# Patient Record
Sex: Female | Born: 1960
Health system: Southern US, Community
[De-identification: ages and names within clinical notes are randomized; demographics above are authoritative.]

## PROBLEM LIST (undated history)

## (undated) DIAGNOSIS — E079 Disorder of thyroid, unspecified: Secondary | ICD-10-CM

## (undated) DIAGNOSIS — E785 Hyperlipidemia, unspecified: Secondary | ICD-10-CM

## (undated) DIAGNOSIS — G4733 Obstructive sleep apnea (adult) (pediatric): Secondary | ICD-10-CM

## (undated) DIAGNOSIS — R569 Unspecified convulsions: Secondary | ICD-10-CM

## (undated) DIAGNOSIS — G4710 Hypersomnia, unspecified: Secondary | ICD-10-CM

## (undated) HISTORY — DX: Obstructive sleep apnea (adult) (pediatric): G47.33

## (undated) HISTORY — DX: Hypersomnia, unspecified: G47.10

## (undated) HISTORY — DX: Disorder of thyroid, unspecified: E07.9

## (undated) HISTORY — DX: Hyperlipidemia, unspecified: E78.5

## (undated) HISTORY — DX: Unspecified convulsions: R56.9

---

## 1997-09-01 ENCOUNTER — Ambulatory Visit (HOSPITAL_COMMUNITY): Admission: RE | Admit: 1997-09-01 | Discharge: 1997-09-01 | Payer: Self-pay | Admitting: Obstetrics and Gynecology

## 1998-10-12 ENCOUNTER — Other Ambulatory Visit: Admission: RE | Admit: 1998-10-12 | Discharge: 1998-10-12 | Payer: Self-pay | Admitting: Obstetrics and Gynecology

## 2000-02-13 ENCOUNTER — Other Ambulatory Visit: Admission: RE | Admit: 2000-02-13 | Discharge: 2000-02-13 | Payer: Self-pay | Admitting: Obstetrics and Gynecology

## 2001-03-19 ENCOUNTER — Other Ambulatory Visit: Admission: RE | Admit: 2001-03-19 | Discharge: 2001-03-19 | Payer: Self-pay | Admitting: Obstetrics and Gynecology

## 2002-05-05 ENCOUNTER — Other Ambulatory Visit: Admission: RE | Admit: 2002-05-05 | Discharge: 2002-05-05 | Payer: Self-pay | Admitting: Obstetrics and Gynecology

## 2003-06-02 ENCOUNTER — Other Ambulatory Visit: Admission: RE | Admit: 2003-06-02 | Discharge: 2003-06-02 | Payer: Self-pay | Admitting: Obstetrics and Gynecology

## 2004-08-15 ENCOUNTER — Other Ambulatory Visit: Admission: RE | Admit: 2004-08-15 | Discharge: 2004-08-15 | Payer: Self-pay | Admitting: Obstetrics and Gynecology

## 2012-10-08 ENCOUNTER — Telehealth: Payer: Self-pay | Admitting: Neurology

## 2012-10-09 ENCOUNTER — Encounter: Payer: Self-pay | Admitting: Neurology

## 2012-10-09 ENCOUNTER — Telehealth: Payer: Self-pay | Admitting: Neurology

## 2012-10-09 DIAGNOSIS — G40309 Generalized idiopathic epilepsy and epileptic syndromes, not intractable, without status epilepticus: Secondary | ICD-10-CM | POA: Insufficient documentation

## 2012-10-09 DIAGNOSIS — G40219 Localization-related (focal) (partial) symptomatic epilepsy and epileptic syndromes with complex partial seizures, intractable, without status epilepticus: Secondary | ICD-10-CM | POA: Insufficient documentation

## 2012-10-09 NOTE — Telephone Encounter (Signed)
Pt calling for refill of medication for seizures.   Needs appt .then can refill.  Sent to [facility] in scheduling.

## 2012-10-12 ENCOUNTER — Other Ambulatory Visit: Payer: Self-pay

## 2012-10-12 MED ORDER — CARBAMAZEPINE 200 MG PO TABS: 400.0000 mg | ORAL_TABLET | Freq: Two times a day (BID) | ORAL | Status: DC

## 2012-10-12 NOTE — Telephone Encounter (Signed)
System will not allow me to refill med on this encounter.  It says the order is being placed on a future encounter an cannot be edited.  I will open new encounter and send one refill for now, since we are waiting for an appt to be scheduled.

## 2012-10-12 NOTE — Telephone Encounter (Signed)
Former [facility] patient assigned to Dr Dohmeier  

## 2012-10-14 ENCOUNTER — Other Ambulatory Visit: Payer: Self-pay | Admitting: Neurology

## 2012-10-14 MED ORDER — CARBAMAZEPINE 200 MG PO TABS: 400.0000 mg | ORAL_TABLET | Freq: Two times a day (BID) | ORAL | Status: DC

## 2012-10-14 MED ORDER — CLORAZEPATE DIPOTASSIUM 3.75 MG PO TABS: 3.7500 mg | ORAL_TABLET | Freq: Two times a day (BID) | ORAL | Status: DC

## 2012-10-14 NOTE — Telephone Encounter (Signed)
Patient has been assigned to Dr Vickey Huger.

## 2012-10-16 NOTE — Telephone Encounter (Signed)
done

## 2012-11-10 ENCOUNTER — Ambulatory Visit: Payer: Self-pay | Admitting: Neurology

## 2012-11-12 ENCOUNTER — Other Ambulatory Visit (HOSPITAL_COMMUNITY)
Admission: RE | Admit: 2012-11-12 | Discharge: 2012-11-12 | Disposition: A | Discharge status: Home / Self Care | Payer: BC Managed Care – PPO | Source: Ambulatory Visit | Attending: Family Medicine | Admitting: Family Medicine

## 2012-11-12 ENCOUNTER — Other Ambulatory Visit: Payer: Self-pay | Admitting: Physician Assistant

## 2012-11-12 DIAGNOSIS — Z124 Encounter for screening for malignant neoplasm of cervix: Secondary | ICD-10-CM | POA: Insufficient documentation

## 2012-12-17 ENCOUNTER — Other Ambulatory Visit: Payer: Self-pay | Admitting: Neurology

## 2012-12-17 ENCOUNTER — Encounter: Payer: Self-pay | Admitting: Neurology

## 2012-12-17 ENCOUNTER — Ambulatory Visit (INDEPENDENT_AMBULATORY_CARE_PROVIDER_SITE_OTHER): Payer: BC Managed Care – PPO | Admitting: Neurology

## 2012-12-17 VITALS — BP 118/76 | HR 77 | Resp 16 | Ht 61.0 in | Wt 193.0 lb

## 2012-12-17 DIAGNOSIS — G4710 Hypersomnia, unspecified: Secondary | ICD-10-CM

## 2012-12-17 DIAGNOSIS — R0683 Snoring: Secondary | ICD-10-CM

## 2012-12-17 DIAGNOSIS — G40209 Localization-related (focal) (partial) symptomatic epilepsy and epileptic syndromes with complex partial seizures, not intractable, without status epilepticus: Secondary | ICD-10-CM

## 2012-12-17 DIAGNOSIS — G4730 Sleep apnea, unspecified: Secondary | ICD-10-CM

## 2012-12-17 DIAGNOSIS — R0989 Other specified symptoms and signs involving the circulatory and respiratory systems: Secondary | ICD-10-CM

## 2012-12-17 DIAGNOSIS — G40219 Localization-related (focal) (partial) symptomatic epilepsy and epileptic syndromes with complex partial seizures, intractable, without status epilepticus: Secondary | ICD-10-CM

## 2012-12-17 DIAGNOSIS — R0609 Other forms of dyspnea: Secondary | ICD-10-CM

## 2012-12-17 DIAGNOSIS — G40309 Generalized idiopathic epilepsy and epileptic syndromes, not intractable, without status epilepticus: Secondary | ICD-10-CM

## 2012-12-17 HISTORY — DX: Hypersomnia, unspecified: G47.10

## 2012-12-17 MED ORDER — CLORAZEPATE DIPOTASSIUM 3.75 MG PO TABS: 3.7500 mg | ORAL_TABLET | Freq: Two times a day (BID) | ORAL | Status: DC

## 2012-12-17 MED ORDER — CARBAMAZEPINE 200 MG PO TABS: 200.0000 mg | ORAL_TABLET | Freq: Two times a day (BID) | ORAL | Status: DC

## 2012-12-17 NOTE — Progress Notes (Signed)
[facility]  Provider:  Dr Vickey Huger Referring Provider: No ref. provider found Primary Care Physician:  REDMON,NOELLE, PA-C  Chief Complaint  Patient presents with  . Follow-up    epilepsy,rm 10,formerly Dr. Imagene Gurney patient    HPI:  Lori Arellano is a 52 y.o. female here as a referral from [facility] .    Lori Arellano's history as a patient of Dr. Sandria Manly and Dr. Ninetta Lights reaches back over 30 years. The patient began having seizures during her kindergarten year is. The diagnosis was of a was not made until the second grade of elementary school at that time she reports hearing voices and she would exited involuntary behavior followed by a complex partial seizure she also developed secondary generalized seizures after she married and moved to [place] she returned to [place] in 1995 and again was followed by Dr. love. 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 A CT scan and 80 showed a small cystic structure as well same location. EEG studies in 1991 are normal in the awake state.  On average this patient has suffered approximately one seizure per year but was seizure-free on going from 2006 her or her or portal meter include tingling of her face or ringing in the ear difficulties focusing feeling foggy than followed by a complex partial seizure these are as lasted for about 30 seconds some of them were never followed by seizures. The patient remains on carbamazepine 200 mg twice a day, and see in 3.75 mg 1 tablet twice a day, she also is treated for hypothyroidism 100 mcg of levothyroxine once daily she takes Vytorin once daily multivitamins once daily and 1200 milligrams of calcium twice a day. She saw her primary neurologist Dr. Sandria Manly in March 2011 and  Last in April 2013 each visit was routine- there was no  longer any aura experienced or reported.   In her last visit Dr. Sandria Manly  of addressed the possibility that she may suffer from obstructive sleep apnea but she did not want to pursue an evaluation at that time. The patient has continued to gain weight. She occasionally has nights was very poor sleep quality and feels not restored or refreshed in the morning. There is no report of nocturnal seizures. The patient average bedtime is around 11 PM at times it may take her 30 minutes to fall asleep and at others 2 hours.  She rises at 5.45 AM , unrefreshed. Dry mouth, no headaches . She has occasional hot flashes and is in menopause. She may have an average sleep time total of 6 hours . Nocturia is once at night, husband reports loud snoring since 2000, the year she reached a 30 pound weight gain up  from 165 pounds.      She discontinued birth control pills in October 2008,  normal liver function tests and thyroid studies in 2011 and 2012, last carbamazepine level in 2013 was  8.4 , in 2012 it was 9.9.     Review of Systems: Out of a complete 14 system review, the patient complains of only the following symptoms, and all other reviewed systems are negative.  seizure free since 2006 - no change in meds.    Epworth 13 - falling asleep  Watching TV, while reading. Waiting room,  Drowsy driving. Snoring, weight  gain.  History   Social History  . Marital Status: Married    Spouse Name: N/A    Number of Children: 1  . Years of Education: N/A   Occupational History  .      preschool teacher   Social History Main Topics  . Smoking status: Never Smoker   . Smokeless tobacco: Not on file  . Alcohol Use: Not on file  . Drug Use: Not on file  . Sexually Active: Not on file   Other Topics Concern  . Not on file   Social History Narrative  . No narrative on file    Family History  Problem Relation Age of Onset  . Diabetes Father     Past Medical History  Diagnosis Date  . Thyroid  condition   . Seizures   . Hyperlipidemia     History reviewed. No pertinent past surgical history.  Current Outpatient Prescriptions  Medication Sig Dispense Refill  . calcium carbonate (OS-CAL) 600 MG TABS Take 600 mg by mouth. One tablet in the morning and 1/2 tablet at night      . carbamazepine (TEGRETOL) 200 MG tablet Take 2 tablets (400 mg total) by mouth 2 (two) times daily.  120 tablet  2  . clorazepate (TRANXENE) 3.75 MG tablet Take 1 tablet (3.75 mg total) by mouth 2 (two) times daily.  60 tablet  2  . ezetimibe-simvastatin (VYTORIN) 10-40 MG per tablet Take 1 tablet by mouth at bedtime.      . fish oil-omega-3 fatty acids 1000 MG capsule Take 1,200 mg by mouth 2 (two) times daily.      Marland Kitchen levothyroxine (SYNTHROID, LEVOTHROID) 100 MCG tablet Take 100 mcg by mouth daily before breakfast.      . Multiple Vitamin (MULTIVITAMIN) tablet Take 1 tablet by mouth daily.       No current facility-administered medications for this visit.    Allergies as of 12/17/2012  . (No Known Allergies)    Vitals: BP 118/76  Pulse 77  Resp 16  Ht 5\' 1"  (1.549 m)  Wt 193 lb (87.544 kg)  BMI 36.49 kg/m2 Last Weight:  Wt Readings from Last 1 Encounters:  12/17/12 193 lb (87.544 kg)   Last Height:   Ht Readings from Last 1 Encounters:  12/17/12 5\' 1"  (1.549 m)    Physical exam:  General: The patient is awake, alert and appears not in acute distress. The patient is well groomed. Head: Normocephalic, atraumatic. Neck is supple. Mallampati 3 -4 , neck circumference: 16 inches, no TMJ, no nasal deviation.  Cardiovascular:  Regular rate and rhythm, without  murmurs or carotid bruit, and without distended neck veins. Respiratory: Lungs are clear to auscultation. Skin:  Without evidence of edema, or rash Trunk: BMI is elevated - patient  has normal posture.  Neurologic exam : The patient is awake and alert, oriented to place and time.  Memory subjective  described as intact. There is a  normal attention span & concentration ability.  Speech is fluent without  dysarthria, dysphonia or aphasia.  Mood and affect are appropriate.  Cranial nerves: Pupils are equal and briskly reactive to light. Funduscopic exam without  evidence of pallor or edema.  Extraocular movements  in vertical and horizontal planes intact and without nystagmus. Visual fields by finger perimetry are intact. Hearing to finger rub intact.  Facial sensation intact to fine touch. Facial motor strength is symmetric and tongue and uvula move midline.  Motor exam:   Normal tone  and normal muscle bulk and symmetric normal strength in all extremities.  Sensory:  Fine touch, pinprick and vibration were tested in all extremities. Proprioception is  Normal. She has reported numbness in the  Ring- and small finger of her left hand, median nerve , she wakes up with tingling numbness, sometimes shaking the hand will help it.   Coordination: Rapid alternating movements in the fingers/hands is tested and normal. Finger-to-nose maneuver without evidence of ataxia, dysmetria or tremor.  Gait and station: Patient walks without assistive device . Strength within normal limits. Stance is stable and normal. Tandem gait is  unfragmented. Romberg testing is normal.  Deep tendon reflexes: in the  upper and lower extremities are symmetric and intact. Babinski maneuver response is  downgoing.   Assessment:  After physical and neurologic examination, review of laboratory studies, imaging, neurophysiology testing and pre-existing records, assessment will be reviewed on the problem list.   1)Epilepsy since childhood, secondary generalizing ," Jacksonian March " reported.  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  2)Likely OSA , loud snoring, morbidly obese at this point.   Plan:  Treatment plan and additional workup will be reviewed under Problem List. continue epilepsy medication as used to this point. Level of CMZ and natrium , CBC , And  order HST for OSA testing.

## 2012-12-17 NOTE — Patient Instructions (Signed)
2 and 5 diet ,  Epworth Sleepiness Scale  The Epworth Sleepiness Scale is a measurement of how likely you are to doze off or fall asleep in various situations, compared to feeling just tired. The Epworth Sleepiness Scale (below) was developed by researchers in [place] and is widely used by sleep professionals around the world to measure sleep deprivation (not getting enough sleep). Use the following number scale to choose the best match for each situation, and then add up your points.  0 = no chance of dozing.  1 = slight chance of dozing.  2 = moderate chance of dozing.  3 = high chance of dozing. SITUATIONS Rate your chance of dozing in each situation below.  Sitting and reading: _________  Watching TV: _________  Sitting inactive in a public place (for example a theater or a meeting): _________  As a passenger in a car for an hour without a break: _________  Lying down to rest in the afternoon when circumstances permit: _________  Sitting and talking to someone: _________  Sitting quietly after a lunch without alcohol: _________  In a car, while stopped for a few minutes in traffic: _________  Total Score: _________ SCALE KEY  1 to 6: Congratulations, you are getting enough sleep.  7 to 8: Your score is average.  9 or more: Seek the advice of a sleep specialist without delay. Document Released: 11/17/2002 Document Revised: 08/05/2011 Document Reviewed: 05/13/2005 [facility] Patient Information 2014 [place], [place].    Exercise to Lose Weight Exercise and a healthy diet may help you lose weight. Your doctor may suggest specific exercises. EXERCISE IDEAS AND TIPS  Choose low-cost things you enjoy doing, such as walking, bicycling, or exercising to workout videos.  Take stairs instead of the elevator.  Walk during your lunch break.  Park your car further away from work or school.  Go to a gym or an exercise class.  Start with 5 to 10 minutes of exercise each  day. Build up to 30 minutes of exercise 4 to 6 days a week.  Wear shoes with good support and comfortable clothes.  Stretch before and after working out.  Work out until you breathe harder and your heart beats faster.  Drink extra water when you exercise.  Do not do so much that you hurt yourself, feel dizzy, or get very short of breath. Exercises that burn about 150 calories:  Running 1  miles in 15 minutes.  Playing volleyball for 45 to 60 minutes.  Washing and waxing a car for 45 to 60 minutes.  Playing touch football for 45 minutes.  Walking 1  miles in 35 minutes.  Pushing a stroller 1  miles in 30 minutes.  Playing basketball for 30 minutes.  Raking leaves for 30 minutes.  Bicycling 5 miles in 30 minutes.  Walking 2 miles in 30 minutes.  Dancing for 30 minutes.  Shoveling snow for 15 minutes.  Swimming laps for 20 minutes.  Walking up stairs for 15 minutes.  Bicycling 4 miles in 15 minutes.  Gardening for 30 to 45 minutes.  Jumping rope for 15 minutes.  Washing windows or floors for 45 to 60 minutes. Document Released: 06/15/2010 Document Revised: 08/05/2011 Document Reviewed: 06/15/2010 [facility] Patient Information 2014 [place], [place].

## 2012-12-18 LAB — BASIC METABOLIC PANEL
BUN: 13 mg/dL (ref 6–24)
Calcium: 9.2 mg/dL (ref 8.7–10.2)
Chloride: 102 mmol/L (ref 97–108)
GFR calc Af Amer: 115 mL/min/{1.73_m2} (ref >59)
GFR calc non Af Amer: 100 mL/min/{1.73_m2} (ref >59)
Glucose: 90 mg/dL (ref 65–99)

## 2012-12-18 LAB — CARBAMAZEPINE LEVEL, TOTAL: Carbamazepine Lvl: 6.7 ug/mL (ref 4.0–12.0)

## 2012-12-18 LAB — CBC, NO DIFFERENTIAL/PLATELET
RBC: 4.34 x10E6/uL (ref 3.77–5.28)
WBC: 9.9 10*3/uL (ref 3.4–10.8)

## 2012-12-25 NOTE — Progress Notes (Signed)
Quick Note:  All labs in normal range , including the carbamazepine level.  Nikolay Demetriou, MD  ______

## 2012-12-30 ENCOUNTER — Telehealth: Payer: Self-pay

## 2012-12-30 ENCOUNTER — Other Ambulatory Visit: Payer: Self-pay | Admitting: Neurology

## 2012-12-30 NOTE — Progress Notes (Signed)
Order has been cancelled for HST.

## 2012-12-30 NOTE — Telephone Encounter (Signed)
Called and gave patient results for labs, including tegretol level. She had no questions.

## 2012-12-30 NOTE — Telephone Encounter (Signed)
Message copied by [facility] on Wed Dec 30, 2012  5:30 PM ------      Message from: [facility], CARMEN      Created: Fri Dec 25, 2012  3:53 PM       All labs in normal range , including the  carbamazepine level.       DOHMEIER,CARMEN, MD       ------

## 2013-01-12 ENCOUNTER — Ambulatory Visit (INDEPENDENT_AMBULATORY_CARE_PROVIDER_SITE_OTHER): Payer: BC Managed Care – PPO | Admitting: Neurology

## 2013-01-12 ENCOUNTER — Other Ambulatory Visit: Payer: Self-pay | Admitting: Neurology

## 2013-01-12 DIAGNOSIS — R400 Somnolence: Secondary | ICD-10-CM

## 2013-01-12 DIAGNOSIS — G4733 Obstructive sleep apnea (adult) (pediatric): Secondary | ICD-10-CM

## 2013-01-12 DIAGNOSIS — G40909 Epilepsy, unspecified, not intractable, without status epilepticus: Secondary | ICD-10-CM

## 2013-01-12 DIAGNOSIS — R0683 Snoring: Secondary | ICD-10-CM

## 2013-02-02 ENCOUNTER — Encounter: Payer: Self-pay | Admitting: *Deleted

## 2013-02-02 ENCOUNTER — Other Ambulatory Visit: Payer: Self-pay | Admitting: *Deleted

## 2013-02-02 ENCOUNTER — Telehealth: Payer: Self-pay | Admitting: *Deleted

## 2013-02-02 DIAGNOSIS — R0683 Snoring: Secondary | ICD-10-CM

## 2013-02-02 DIAGNOSIS — G4733 Obstructive sleep apnea (adult) (pediatric): Secondary | ICD-10-CM

## 2013-02-02 DIAGNOSIS — R400 Somnolence: Secondary | ICD-10-CM

## 2013-02-02 DIAGNOSIS — G40909 Epilepsy, unspecified, not intractable, without status epilepticus: Secondary | ICD-10-CM

## 2013-02-02 NOTE — Telephone Encounter (Signed)
Left message for patient regarding sleep study results, asked patient to call me back to discuss results and have questions answered.  Explained that a copy of the sleep study was sent to referring physician and copy of study is coming to them in the mail.  Gave her some brief information about mild OSA diagnosis which becomes moderate in REM sleep and let her know that Dr. Vickey Huger has requested that she return for a gentle CPAP Titration study. -sh

## 2013-02-03 NOTE — Telephone Encounter (Signed)
Pt came in to sleep lab today and we discussed results in person.  Patient scheduled CPAP Titration for 03/01/2013 at 8:00 PM.

## 2013-03-01 ENCOUNTER — Ambulatory Visit (INDEPENDENT_AMBULATORY_CARE_PROVIDER_SITE_OTHER): Payer: BC Managed Care – PPO | Admitting: Neurology

## 2013-03-01 VITALS — Ht 61.0 in | Wt 193.0 lb

## 2013-03-01 DIAGNOSIS — G4733 Obstructive sleep apnea (adult) (pediatric): Secondary | ICD-10-CM

## 2013-03-11 ENCOUNTER — Telehealth: Payer: Self-pay | Admitting: Neurology

## 2013-03-11 ENCOUNTER — Encounter: Payer: Self-pay | Admitting: *Deleted

## 2013-03-11 ENCOUNTER — Other Ambulatory Visit: Payer: Self-pay | Admitting: *Deleted

## 2013-03-11 DIAGNOSIS — G4733 Obstructive sleep apnea (adult) (pediatric): Secondary | ICD-10-CM

## 2013-03-11 NOTE — Telephone Encounter (Signed)
Called patient to discuss sleep study results.  Discussed findings, recommendations and follow up care.  Patient understood well and all questions were answered.  Patient results will be mailed to her and faxed to [facility], PA-C

## 2013-04-21 ENCOUNTER — Other Ambulatory Visit: Payer: Self-pay

## 2013-04-21 DIAGNOSIS — Z1231 Encounter for screening mammogram for malignant neoplasm of breast: Secondary | ICD-10-CM

## 2013-06-02 ENCOUNTER — Ambulatory Visit
Admission: RE | Admit: 2013-06-02 | Discharge: 2013-06-02 | Disposition: A | Discharge status: Home / Self Care | Payer: BC Managed Care – PPO | Source: Ambulatory Visit

## 2013-06-02 DIAGNOSIS — Z1231 Encounter for screening mammogram for malignant neoplasm of breast: Secondary | ICD-10-CM

## 2013-07-15 NOTE — Telephone Encounter (Signed)
Pt came in for her visit closing encounter °

## 2013-10-16 ENCOUNTER — Other Ambulatory Visit: Payer: Self-pay | Admitting: Neurology

## 2013-10-19 ENCOUNTER — Other Ambulatory Visit: Payer: Self-pay

## 2013-10-19 DIAGNOSIS — G40209 Localization-related (focal) (partial) symptomatic epilepsy and epileptic syndromes with complex partial seizures, not intractable, without status epilepticus: Secondary | ICD-10-CM

## 2013-10-19 MED ORDER — CLORAZEPATE DIPOTASSIUM 3.75 MG PO TABS: 3.7500 mg | ORAL_TABLET | Freq: Two times a day (BID) | ORAL | Status: DC

## 2013-10-20 NOTE — Telephone Encounter (Signed)
Rx has been faxed.

## 2013-12-16 ENCOUNTER — Ambulatory Visit: Payer: BC Managed Care – PPO | Admitting: Neurology

## 2014-01-05 ENCOUNTER — Other Ambulatory Visit (HOSPITAL_COMMUNITY)
Admission: RE | Admit: 2014-01-05 | Discharge: 2014-01-05 | Disposition: A | Discharge status: Home / Self Care | Payer: BC Managed Care – PPO | Source: Ambulatory Visit | Attending: Family Medicine | Admitting: Family Medicine

## 2014-01-05 ENCOUNTER — Other Ambulatory Visit: Payer: Self-pay | Admitting: Physician Assistant

## 2014-01-05 DIAGNOSIS — Z124 Encounter for screening for malignant neoplasm of cervix: Secondary | ICD-10-CM | POA: Insufficient documentation

## 2014-01-06 LAB — CYTOLOGY - PAP

## 2014-01-19 ENCOUNTER — Ambulatory Visit: Payer: BC Managed Care – PPO | Admitting: Neurology

## 2014-02-16 ENCOUNTER — Other Ambulatory Visit: Payer: Self-pay | Admitting: Neurology

## 2014-04-19 ENCOUNTER — Other Ambulatory Visit: Payer: Self-pay | Admitting: Neurology

## 2014-04-28 ENCOUNTER — Ambulatory Visit (INDEPENDENT_AMBULATORY_CARE_PROVIDER_SITE_OTHER): Payer: BC Managed Care – PPO | Admitting: Neurology

## 2014-04-28 ENCOUNTER — Encounter: Payer: Self-pay | Admitting: Neurology

## 2014-04-28 VITALS — BP 113/72 | HR 88 | Resp 16 | Ht 62.25 in

## 2014-04-28 DIAGNOSIS — G40109 Localization-related (focal) (partial) symptomatic epilepsy and epileptic syndromes with simple partial seizures, not intractable, without status epilepticus: Secondary | ICD-10-CM

## 2014-04-28 DIAGNOSIS — G4733 Obstructive sleep apnea (adult) (pediatric): Secondary | ICD-10-CM | POA: Insufficient documentation

## 2014-04-28 DIAGNOSIS — G40209 Localization-related (focal) (partial) symptomatic epilepsy and epileptic syndromes with complex partial seizures, not intractable, without status epilepticus: Secondary | ICD-10-CM

## 2014-04-28 DIAGNOSIS — H9313 Tinnitus, bilateral: Secondary | ICD-10-CM

## 2014-04-28 MED ORDER — CARBAMAZEPINE 200 MG PO TABS: 400.0000 mg | ORAL_TABLET | Freq: Two times a day (BID) | ORAL | Status: DC

## 2014-04-28 MED ORDER — CLORAZEPATE DIPOTASSIUM 3.75 MG PO TABS
3.7500 mg | ORAL_TABLET | Freq: Two times a day (BID) | ORAL | Status: DC
Start: 2014-04-28 — End: 2014-12-16

## 2014-04-28 NOTE — Patient Instructions (Signed)
Carbamazepine extended-release tablets What is this medicine? CARBAMAZEPINE (kar ba MAZ e peen) is used to control seizures caused by certain types of epilepsy. This medicine is also used to treat nerve related pain. It is not for common aches and pains. This medicine may be used for other purposes; ask your health care provider or pharmacist if you have questions. COMMON BRAND NAME(S): Tegretol -XR What should I tell my health care provider before I take this medicine? They need to know if you have any of these conditions: -Asian ancestry -bone marrow disease -glaucoma -heart disease or irregular heartbeat -kidney disease -liver disease -low blood counts, like low white cell, platelet, or red cell counts -porphyria -psychotic disorders -suicidal thoughts, plans, or attempt; a previous suicide attempt by you or a family member -an unusual or allergic reaction to carbamazepine, tricyclic antidepressants, phenytoin, phenobarbital or other medicines, foods, dyes, or preservatives -pregnant or trying to get pregnant -breast-feeding How should I use this medicine? Take this medicine by mouth with a glass of water. Follow the directions on the prescription label. Swallow whole, do not cut, crush or chew. The tablets should be inspected for chips or cracks. Do not take any tablets that are damaged. Take this medicine with food. Take your doses at regular intervals. Do not take your medicine more often than directed. Do not stop taking this medicine except on the advice of your doctor or health care professional. A special MedGuide will be given to you by the pharmacist with each prescription and refill. Be sure to read this information carefully each time. Talk to your pediatrician regarding the use of this medicine in children. Special care may be needed. Overdosage: If you think you have taken too much of this medicine contact a poison control center or emergency room at once. NOTE: This medicine  is only for you. Do not share this medicine with others. What if I miss a dose? If you miss a dose, take it as soon as you can. If it is almost time for your next dose, take only that dose. Do not take double or extra doses. What may interact with this medicine? Do not take this medicine with any of the following medications: -delavirdine -MAOIs like Carbex, Eldepryl, Marplan, Nardil, and Parnate -nefazodone -oxcarbazepine This medicine may also interact with the following medications: -acetaminophen -acetazolamide -barbiturate medicines for inducing sleep or treating seizures, like phenobarbital -certain antibiotics like clarithromycin, erythromycin or troleandomycin -cimetidine -cyclosporine -danazol -dicumarol -doxycycline -female hormones, including estrogens and birth control pills -grapefruit juice -isoniazid, INH -levothyroxine and other thyroid hormones -lithium and other medicines to treat mood problems or psychotic disturbances -loratadine -medicines for angina or high blood pressure -medicines for cancer -medicines for depression or anxiety -medicines for sleep -medicines to treat fungal infections, like fluconazole, itraconazole or ketoconazole -medicines used to treat HIV infection or AIDS -methadone -niacinamide -praziquantel -propoxyphene -rifampin or rifabutin -seizure or epilepsy medicine -steroid medicines such as prednisone or cortisone -theophylline -tramadol -warfarin This list may not describe all possible interactions. Give your health care provider a list of all the medicines, herbs, non-prescription drugs, or dietary supplements you use. Also tell them if you smoke, drink alcohol, or use illegal drugs. Some items may interact with your medicine. What should I watch for while using this medicine? Visit your doctor or health care professional for a regular check on your progress. Do not change brands or dosage forms of this medicine without discussing  the change with your doctor or health care professional.  If you are taking this medicine for epilepsy (seizures) do not stop taking it suddenly. This increases the risk of seizures. Wear a [employer] Alert bracelet or necklace. Carry an identification card with information about your condition, medications, and doctor or health care professional. Bonita QuinYou may get drowsy, dizzy, or have blurred vision. Do not drive, use machinery, or do anything that needs mental alertness until you know how this medicine affects you. To reduce dizzy or fainting spells, do not sit or stand up quickly, especially if you are an older patient. Alcohol can increase drowsiness and dizziness. Avoid alcoholic drinks. Birth control pills may not work properly while you are taking this medicine. Talk to your doctor about using an extra method of birth control. This medicine can make you more sensitive to the sun. Keep out of the sun. If you cannot avoid being in the sun, wear protective clothing and use sunscreen. Do not use sun lamps or tanning beds/booths. The coating on the tablets is not absorbed in the body and you may notice it in your stool. This is no cause for concern. The use of this medicine may increase the chance of suicidal thoughts or actions. Pay special attention to how you are responding while on this medicine. Any worsening of mood, or thoughts of suicide or dying should be reported to your health care professional right away. Women who become pregnant while using this medicine may enroll in the [place] Antiepileptic Drug Pregnancy Registry by calling [phone]. This registry collects information about the safety of antiepileptic drug use during pregnancy. What side effects may I notice from receiving this medicine? Side effects that you should report to your doctor or health care professional as soon as possible: -allergic reactions like skin rash, itching or hives, swelling of the face, lips, or  tongue -breathing problems -changes in vision -confusion -dark urine -fast or irregular heartbeat -fever or chills, sore throat -mouth ulcers -pain or difficulty passing urine -redness, blistering, peeling or loosening of the skin, including inside the mouth -ringing in the ears -seizures -stomach pain -swollen joints or muscle/joint aches and pains -unusual bleeding or bruising -unusually weak or tired -vomiting -worsening of mood, thoughts or actions of suicide or dying -yellowing of the eyes or skin Side effects that usually do not require medical attention (report to your doctor or health care professional if they continue or are bothersome): -clumsiness or unsteadiness -diarrhea or constipation -headache -increased sweating -nausea This list may not describe all possible side effects. Call your doctor for medical advice about side effects. You may report side effects to FDA at 1-800-FDA-1088. Where should I keep my medicine? Keep out of reach of children. Store at room temperature between 15 and 30 degrees C (59 and 86 degrees F). Keep container tightly closed. Protect from moisture. Throw away any unused medicine after the expiration date. NOTE: This sheet is a summary. It may not cover all possible information. If you have questions about this medicine, talk to your doctor, pharmacist, or health care provider.  2015, Elsevier/Gold Standard. (2012-07-21 15:30:31)

## 2014-04-28 NOTE — Addendum Note (Signed)
Addended by: Melvyn NovasHMEIER, Raymir Frommelt on: 04/28/2014 04:00 PM   Modules accepted: Orders

## 2014-04-28 NOTE — Progress Notes (Signed)
[facility]  Provider:  Dr Vickey Hugerohmeier Referring Provider: Milus Heightedmon, Noelle, PA-C Primary Care Physician:  REDMON,NOELLE, PA-C  Chief Complaint  Patient presents with  . RV Sleep    Rm 10, alone    HPI:  Lori Arellano is a [age] y.o. female [facility]    Lori Arellano's history as a patient of Dr. Sandria ManlyLove and Dr. Ninetta LightsHatcher reaches back over 30 years. She has Epilepsy and mild OSA.    The patient began having seizures during her kindergarten year -. The diagnosis was of epilepsy was not made until the second grade of elementary school - at that time she reports hearing voices ( auditory aura ) and  involuntary  "behavior" followed by a complex partial seizure.  She further developed secondary generalized seizures after she married and moved to [place].  [place] [facility] in [place] and again was followed by Dr. love. She has a history of at least one seizure a followed by a pulse paralysis of the right side of the body an MRI in 1991 performed in [place] , [place] showed only a small cystic lesion in the left medial temporal lobe. A CT scan and 82 showed a small cystic structure as well same location. EEG studies in 1991 are normal in the awake state.  On average this patient has suffered approximately one seizure per year but was seizure-free ongoing from 2006. The auras include tingling of her face or ringing in the ear,  difficulties focusing,  feeling foggy then followed by a complex partial seizure these are as lasted for about 30 seconds some of them were never followed by seizures.   The patient remains on carbamazepine 200 mg twice a day, and see in 3.75 mg 1 tablet twice a day, she also is treated for hypothyroidism 100 mcg of levothyroxine once daily she takes Vytorin once daily multivitamins once daily and 1200 milligrams of calcium twice a day. She saw her primary neurologist Dr. Sandria ManlyLove in March 2011 and  Last in April 2013 each visit was  routine- there was no longer any aura experienced or reported. In her last visit Dr. Sandria ManlyLove  of addressed the possibility that she may suffer from obstructive sleep apnea but she did not want to pursue an evaluation at that time. The patient has continued to gain weight. She occasionally has nights was very poor sleep quality and feels not restored or refreshed in the morning. There is no report of nocturnal seizures. The patient average bedtime is around 11 PM at times it may take her 30 minutes to fall asleep and at others 2 hours.  She rises at 5.45 AM , unrefreshed. Dry mouth, no headaches . She has occasional hot flashes and is in menopause. She may have an average sleep time total of 6 hours . Nocturia is once at night, husband reports loud snoring since 2000, the year she reached a 30 pound weight gain .  She discontinued birth control pills in October 2008,  normal liver function tests and thyroid studies in 2011 and 2012, last carbamazepine level in 2013 was  8.4 , in 2012 it was 9.9.  She experiences deja vu with here seizures, and was  told before that a cyst in the left dorso- frontal lobe  May have been the focus.      Review of Systems: Out of a complete 14 system review, the patient complains of only the following symptoms, and all other reviewed systems  are negative.  seizure free since 2006 - no change in meds.   Last seizure was in 1999, after a flu- infection.    Epworth 3 , FSS 22  -Snoring, weight gain.  History   Social History  . Marital Status: Married    Spouse Name: N/A    Number of Children: 1  . Years of Education: N/A   Occupational History  .      preschool teacher   Social History Main Topics  . Smoking status: Never Smoker   . Smokeless tobacco: Not on file  . Alcohol Use: 0.0 oz/week    0 Not specified per week     Comment: rare  . Drug Use: No  . Sexual Activity: Yes   Other Topics Concern  . Not on file   Social History Narrative   Right  handed.  Married, one kid.  Caffeine 1 cup avg.  PT preschool teacher.      Family History  Problem Relation Age of Onset  . Diabetes Father     Past Medical History  Diagnosis Date  . Thyroid condition   . Seizures   . Hyperlipidemia   . Hypersomnia 12/17/2012  . OSA (obstructive sleep apnea)     History reviewed. No pertinent past surgical history.  Current Outpatient Prescriptions  Medication Sig Dispense Refill  . calcium carbonate (OS-CAL) 600 MG TABS Take 600 mg by mouth. One tablet in the morning and 1/2 tablet at night    . clorazepate (TRANXENE) 3.75 MG tablet Take 1 tablet (3.75 mg total) by mouth 2 (two) times daily. 180 tablet 1  . EPITOL 200 MG tablet TAKE TWO TABLETS BY MOUTH TWICE DAILY 120 tablet 0  . ezetimibe-simvastatin (VYTORIN) 10-40 MG per tablet Take 1 tablet by mouth at bedtime.    . fish oil-omega-3 fatty acids 1000 MG capsule Take 1,200 mg by mouth daily.     Marland Kitchen. levothyroxine (SYNTHROID, LEVOTHROID) 100 MCG tablet Take 100 mcg by mouth daily before breakfast.    . Multiple Vitamin (MULTIVITAMIN) tablet Take 1 tablet by mouth daily.     No current facility-administered medications for this visit.    Allergies as of 04/28/2014  . (No Known Allergies)    Vitals: BP 113/72 mmHg  Pulse 88  Resp 16  Ht 5' 2.25" (1.581 m)  Wt  Last Weight:  Wt Readings from Last 1 Encounters:  03/02/13 193 lb (87.544 kg)   Last Height:   Ht Readings from Last 1 Encounters:  04/28/14 5' 2.25" (1.581 m)    Physical exam:  General: The patient is awake, alert and appears not in acute distress. The patient is well groomed. Head: Normocephalic, atraumatic. Neck is supple. Mallampati 3 -4 , neck circumference: 16 inches, no TMJ, no nasal deviation.  Cardiovascular:  Regular rate and rhythm, without  murmurs or carotid bruit, and without distended neck veins. Respiratory: Lungs are clear to auscultation. Skin:  Without evidence of edema, or rash Trunk: BMI is  elevated - patient  has normal posture.  Neurologic exam : The patient is awake and alert, oriented to place and time.  Memory subjective  described as intact. There is a normal attention span & concentration ability.  Speech is fluent without  dysarthria, dysphonia or aphasia.  Mood and affect are appropriate.  Cranial nerves: Pupils are equal and briskly reactive to light. Funduscopic exam without  evidence of pallor or edema.  Extraocular movements  in vertical and horizontal planes intact and  without nystagmus. Visual fields by finger perimetry are intact. Hearing to finger rub intact.  Facial sensation intact to fine touch. Facial motor strength is symmetric and tongue and uvula move midline.  Motor exam:   Normal tone and normal muscle bulk , symmetric normal strength in all extremities. Sensory:  Fine touch, pinprick and vibration were tested in all extremities. Proprioception is  Normal. She has reported numbness in the  Ring- and small finger of her left hand, median nerve , she wakes up with tingling numbness, sometimes shaking the hand will help it.  Coordination: Rapid alternating movements in the fingers/hands is tested and normal. Finger-to-nose maneuver without evidence of ataxia, dysmetria or tremor. Gait and station: Patient walks without assistive device . Strength within normal limits. Stance is stable and normal.  Deep tendon reflexes: in the  upper and lower extremities are symmetric and intact. Babinski maneuver response is downgoing.   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 No change in medication since last seen by Dr. Sandria Manly.  1)Epilepsy since childhood, secondary generalizing ," Jacksonian March " reported.  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  2)mild OSA  , loud snoring, morbidly obese at this point. Still not wanting CPAP.  Plan:  Treatment plan and additional workup will be reviewed under Problem List. continue epilepsy medication as used to this point. Level of CMZ and natrium , CBC  She has not reported ataxia, diplopia, and has only one new symptom , a pulsating tinnitus, in both ears.  MRI brain to rule out a vascular malformation.     Jemeka Wagler, MD

## 2014-04-29 LAB — COMPREHENSIVE METABOLIC PANEL
ALT: 15 IU/L (ref 0–32)
AST: 16 IU/L (ref 0–40)
Albumin/Globulin Ratio: 1.8 (ref 1.1–2.5)
Albumin: 4.6 g/dL (ref 3.5–5.5)
Alkaline Phosphatase: 115 IU/L (ref 39–117)
BUN / CREAT RATIO: 22 (ref 9–23)
BUN: 14 mg/dL (ref 6–24)
CO2: 27 mmol/L (ref 18–29)
CREATININE: 0.64 mg/dL (ref 0.57–1.00)
Calcium: 9.5 mg/dL (ref 8.7–10.2)
Chloride: 101 mmol/L (ref 97–108)
GFR calc Af Amer: 116 mL/min/{1.73_m2} (ref >59)
GFR calc non Af Amer: 101 mL/min/{1.73_m2} (ref >59)
GLOBULIN, TOTAL: 2.5 g/dL (ref 1.5–4.5)
Glucose: 98 mg/dL (ref 65–99)
Potassium: 3.7 mmol/L (ref 3.5–5.2)
Sodium: 143 mmol/L (ref 134–144)
Total Protein: 7.1 g/dL (ref 6.0–8.5)

## 2014-04-29 LAB — CBC WITH DIFFERENTIAL/PLATELET
BASOS ABS: 0.1 10*3/uL (ref 0.0–0.2)
Basos: 1 %
EOS ABS: 0.1 10*3/uL (ref 0.0–0.4)
Eos: 1 %
HCT: 41.1 % (ref 34.0–46.6)
Hemoglobin: 14 g/dL (ref 11.1–15.9)
Immature Grans (Abs): 0 10*3/uL (ref 0.0–0.1)
Immature Granulocytes: 0 %
LYMPHS: 36 %
Lymphocytes Absolute: 3.7 10*3/uL — ABNORMAL HIGH (ref 0.7–3.1)
MCH: 30.4 pg (ref 26.6–33.0)
MCHC: 34.1 g/dL (ref 31.5–35.7)
MCV: 89 fL (ref 79–97)
MONOS ABS: 0.9 10*3/uL (ref 0.1–0.9)
Monocytes: 9 %
NEUTROS PCT: 53 %
Neutrophils Absolute: 5.4 10*3/uL (ref 1.4–7.0)
RBC: 4.61 x10E6/uL (ref 3.77–5.28)
RDW: 13.7 % (ref 12.3–15.4)
WBC: 10.1 10*3/uL (ref 3.4–10.8)

## 2014-12-07 ENCOUNTER — Ambulatory Visit (INDEPENDENT_AMBULATORY_CARE_PROVIDER_SITE_OTHER): Payer: 59

## 2014-12-07 DIAGNOSIS — G40209 Localization-related (focal) (partial) symptomatic epilepsy and epileptic syndromes with complex partial seizures, not intractable, without status epilepticus: Secondary | ICD-10-CM

## 2014-12-07 DIAGNOSIS — H9313 Tinnitus, bilateral: Secondary | ICD-10-CM

## 2014-12-07 DIAGNOSIS — G40109 Localization-related (focal) (partial) symptomatic epilepsy and epileptic syndromes with simple partial seizures, not intractable, without status epilepticus: Secondary | ICD-10-CM

## 2014-12-07 DIAGNOSIS — G4733 Obstructive sleep apnea (adult) (pediatric): Secondary | ICD-10-CM

## 2014-12-08 MED ORDER — GADOPENTETATE DIMEGLUMINE 469.01 MG/ML IV SOLN: 17.0000 mL | Freq: Once | INTRAVENOUS | Status: AC | PRN

## 2014-12-16 ENCOUNTER — Other Ambulatory Visit: Payer: Self-pay | Admitting: Neurology

## 2014-12-19 NOTE — Telephone Encounter (Signed)
Rx has been faxed.

## 2014-12-22 ENCOUNTER — Telehealth: Payer: Self-pay

## 2014-12-22 NOTE — Telephone Encounter (Signed)
Called pt and informed her that per Dr. Vickey Huger, her MRI brain was normal. Pt verbalized understanding.

## 2015-05-01 ENCOUNTER — Ambulatory Visit: Payer: BC Managed Care – PPO | Admitting: Adult Health

## 2015-05-04 ENCOUNTER — Ambulatory Visit: Payer: 59 | Admitting: Adult Health

## 2015-05-09 ENCOUNTER — Other Ambulatory Visit: Payer: Self-pay | Admitting: Neurology

## 2015-05-15 ENCOUNTER — Telehealth: Payer: Self-pay | Admitting: Neurology

## 2015-05-15 NOTE — Telephone Encounter (Signed)
Patient called to reschedule appointment with [name], states she was unaware that her appointment was scheduled with a Nurse Practitioner, states someone needs to let her know when they are going to do this and to advise that she wants to be scheduled with Dr. Vickey Hugerohmeier in the future not the Nurse Practitioner.

## 2015-05-15 NOTE — Telephone Encounter (Signed)
Spoke to pt and explained that Dr. Dohmeier ordered pt to see the NP for her yearly follow up. Pt insists that she only sees the doctor and does not want to be scheduled with Megan, NP. I offered an appt with Dr. Dohmeier tomorrow at 10:30 and the pt accepted.  Pt also states that [employer] was supposed to refund her $153.00 because she met her deductible in July 2016 before her MRI and the radiologists at [facility] charged her for her MRI even after she met her deductible. (this is quite confusing). Pt states that [employer] told her that they would be sending her a refund check which she has never got.  I advised pt that I would send this information to our billing manager and hopefully we can have some answers for her by her appt tomorrow. Pt verbalized understanding.  

## 2015-05-16 ENCOUNTER — Ambulatory Visit (INDEPENDENT_AMBULATORY_CARE_PROVIDER_SITE_OTHER): Payer: 59 | Admitting: Neurology

## 2015-05-16 ENCOUNTER — Encounter: Payer: Self-pay | Admitting: Neurology

## 2015-05-16 VITALS — BP 118/82 | HR 82 | Resp 20 | Ht 61.0 in | Wt 181.0 lb

## 2015-05-16 DIAGNOSIS — G40209 Localization-related (focal) (partial) symptomatic epilepsy and epileptic syndromes with complex partial seizures, not intractable, without status epilepticus: Secondary | ICD-10-CM

## 2015-05-16 DIAGNOSIS — IMO0002 Reserved for concepts with insufficient information to code with codable children: Secondary | ICD-10-CM

## 2015-05-16 MED ORDER — CLORAZEPATE DIPOTASSIUM 3.75 MG PO TABS: 3.7500 mg | ORAL_TABLET | Freq: Two times a day (BID) | ORAL | Status: DC

## 2015-05-16 NOTE — Progress Notes (Signed)
[facility]  Provider:  Dr Brett Fairy Referring Provider: Lennie Odor, PA-C Primary Care Physician:  REDMON,NOELLE, PA-C  Chief Complaint  Patient presents with  . Follow-up    denies seizure activty, osa NOT on cpap, rm 11, alone    HPI:  Lori Arellano is a 54 y.o. female here as a referral from [facility] .    Lori Arellano's history as a patient of Dr. Erling Cruz and Dr. Johnnye Sima reaches back over 30 years. She has Epilepsy and mild OSA.    The patient began having seizures during her kindergarten year -. The diagnosis was of epilepsy was not made until the second grade of elementary school - at that time she reports hearing voices ( auditory aura ) and  involuntary  "behavior" followed by a complex partial seizure.  She further developed secondary generalized seizures after she married and moved to [place].  She returned to [facility] in 1995 and again was followed by Dr. love. She has a history of at least one seizure a followed by a pulse paralysis of the right side of the body an MRI in 1991 performed in [place] , [place] showed only a small cystic lesion in the left medial temporal lobe. A CT scan and 82 showed a small cystic structure as well same location. EEG studies in 1991 are normal in the awake state.  On average this patient has suffered approximately one seizure per year but was seizure-free ongoing from 2006. The auras include tingling of her face or ringing in the ear,  difficulties focusing,  feeling foggy then followed by a complex partial seizure these are as lasted for about 30 seconds some of them were never followed by seizures.   The patient remains on carbamazepine 200 mg twice a day, and see in 3.75 mg 1 tablet twice a day, she also is treated for hypothyroidism 100 mcg of levothyroxine once daily she takes Vytorin once daily multivitamins once daily and 1200 milligrams of calcium twice a day. She saw her primary neurologist Dr. Erling Cruz in March 2011 and   Last in April 2013 each visit was routine- there was no longer any aura experienced or reported. In her last visit Dr. Erling Cruz  of addressed the possibility that she may suffer from obstructive sleep apnea but she did not want to pursue an evaluation at that time. The patient has continued to gain weight. She occasionally has nights was very poor sleep quality and feels not restored or refreshed in the morning. There is no report of nocturnal seizures. The patient average bedtime is around 11 PM at times it may take her 30 minutes to fall asleep and at others 2 hours.  She rises at 5.45 AM , unrefreshed. Dry mouth, no headaches . She has occasional hot flashes and is in menopause. She may have an average sleep time total of 6 hours . Nocturia is once at night, husband reports loud snoring since 2000, the year she reached a 30 pound weight gain .  She discontinued birth control pills in October 2008,  normal liver function tests and thyroid studies in 2011 and 2012, last carbamazepine level in 2013 was  8.4 , in 2012 it was 9.9.  She experiences deja vu with here seizures, and was  told before that a cyst in the left dorso- frontal lobe may have been the focus.   05-16-15  I have met today with Lori Arellano to discuss her recent MRI results of the brain interpreted by my  colleague Dr. Joanne Gavel  on 12-07-14 which showed no abnormalities.  The MRI brain was performed with and without contrast. My main interest was to show that there is no mesial temporal sclerosis or hippocampal atrophy. Normal intracranial flow voids for noted the patient had been previously told that she had a small brain cyst this snot foud in the MRI brain. Her Epworth sleepiness score today was 4 points and her fatigue severity score 30 points which are below average. She's not excessively sleepy right now she's not excessively fatigued     Review of Systems: Out of a complete 14 system review, the patient complains of only the  following symptoms, and all other reviewed systems are negative.  seizure free since 2006 - no change in meds.   Last seizure was in 1999, after a flu- infection.    Epworth 4, FSS 40  -Snoring, weight gain.  Social History   Social History  . Marital Status: Married    Spouse Name: N/A  . Number of Children: 1  . Years of Education: N/A   Occupational History  .      preschool teacher   Social History Main Topics  . Smoking status: Never Smoker   . Smokeless tobacco: Not on file  . Alcohol Use: 0.0 oz/week    0 Standard drinks or equivalent per week     Comment: rare  . Drug Use: No  . Sexual Activity: Yes   Other Topics Concern  . Not on file   Social History Narrative   Right handed.  Married, one kid.  Caffeine 1 cup avg.  PT preschool teacher.      Family History  Problem Relation Age of Onset  . Diabetes Father     Past Medical History  Diagnosis Date  . Thyroid condition   . Seizures ([place])   . Hyperlipidemia   . Hypersomnia 12/17/2012  . OSA (obstructive sleep apnea)     No past surgical history on file.  Current Outpatient Prescriptions  Medication Sig Dispense Refill  . calcium carbonate (OS-CAL) 600 MG TABS Take 600 mg by mouth. One tablet in the morning and 1/2 tablet at night    . carbamazepine (TEGRETOL) 200 MG tablet Take 2 tablets (400 mg total) by mouth 2 times daily at 12 noon and 4 pm. 360 tablet 3  . clorazepate (TRANXENE) 3.75 MG tablet TAKE ONE TABLET BY MOUTH TWICE DAILY 180 tablet 1  . ezetimibe-simvastatin (VYTORIN) 10-40 MG per tablet Take 1 tablet by mouth at bedtime.    . fish oil-omega-3 fatty acids 1000 MG capsule Take 1,200 mg by mouth daily.     Marland Kitchen levothyroxine (SYNTHROID, LEVOTHROID) 100 MCG tablet Take 100 mcg by mouth daily before breakfast.    . Multiple Vitamin (MULTIVITAMIN) tablet Take 1 tablet by mouth daily.     No current facility-administered medications for this visit.    Allergies as of 05/16/2015  . (No Known  Allergies)    Vitals: BP 118/82 mmHg  Pulse 82  Resp 20  Ht 5' 1"  (1.549 m)  Wt 181 lb (82.101 kg)  BMI 34.22 kg/m2 Last Weight:  Wt Readings from Last 1 Encounters:  05/16/15 181 lb (82.101 kg)   Last Height:   Ht Readings from Last 1 Encounters:  05/16/15 5' 1"  (1.549 m)    Physical exam:  General: The patient is awake, alert and appears not in acute distress. The patient is well groomed. Head: Normocephalic, atraumatic. Neck is supple.  Mallampati 3 -4 , neck circumference: 16 inches, no TMJ, no nasal deviation.  Cardiovascular: Regular rate and rhythm, without  murmurs or carotid bruit, and without distended neck veins. Respiratory: Lungs are clear to auscultation. Skin:  Without evidence of edema, or rash Trunk: BMI is elevated - patient  has normal posture.  Neurologic exam : The patient is awake and alert, oriented to place and time.  Memory subjective  described as intact. There is a normal attention span & concentration ability.  Speech is fluent without  dysarthria, dysphonia or aphasia.  Mood and affect are appropriate.  Cranial nerves: Pupils are equal and briskly reactive to light. Funduscopic exam without  evidence of pallor or edema.  Extraocular movements  in vertical and horizontal planes intact and without nystagmus. Visual fields by finger perimetry are intact. Hearing to finger rub intact. Facial sensation intact to fine touch. Facial motor strength is symmetric and tongue and uvula move midline.  Motor exam:   Normal tone and normal muscle bulk , symmetric normal strength in all extremities. Sensory:  Fine touch, pinprick and vibration were tested in all extremities. Proprioception is  Normal. She has reported numbness in the  Ring- and small finger of her left hand, median nerve , she wakes up with tingling numbness, sometimes shaking the hand will help it.  Coordination: Rapid alternating movements in the fingers/hands is tested and normal. Finger-to-nose  maneuver without evidence of ataxia, dysmetria or tremor. Gait and station: Patient walks without assistive device. Strength within normal limits. Stance is stable and normal.  Deep tendon reflexes: in the  upper and lower extremities are symmetric and intact. Babinski maneuver response is downgoing.   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 Normal MRI now. Reviewed and discussed.  No change in medication since last seen by Dr. Erling Cruz.   1)Epilepsy since childhood, secondary generalizing ," Jacksonian March " reported.  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  2)mild OSA , loud snoring, morbidly obese at this point. Still not wanting CPAP.  Plan:  Treatment plan and additional workup will be reviewed under Problem List.  Continue epilepsy medication as used to this point. Level of CMZ and natrium, CBC. She has not reported ataxia, diplopia, and has only one new symptom , a pulsating tinnitus, in both ears.    Mehak Roskelley, MD   CC Dr. Barth Kirks Redmon

## 2015-05-17 ENCOUNTER — Other Ambulatory Visit: Payer: Self-pay

## 2015-05-17 DIAGNOSIS — Z1231 Encounter for screening mammogram for malignant neoplasm of breast: Secondary | ICD-10-CM

## 2015-05-18 ENCOUNTER — Ambulatory Visit: Payer: 59 | Admitting: Adult Health

## 2015-06-17 ENCOUNTER — Other Ambulatory Visit: Payer: Self-pay | Admitting: Neurology

## 2015-08-25 ENCOUNTER — Ambulatory Visit
Admission: RE | Admit: 2015-08-25 | Discharge: 2015-08-25 | Disposition: A | Discharge status: Home / Self Care | Payer: BLUE CROSS/BLUE SHIELD | Source: Ambulatory Visit

## 2015-08-25 DIAGNOSIS — Z1231 Encounter for screening mammogram for malignant neoplasm of breast: Secondary | ICD-10-CM

## 2015-12-02 ENCOUNTER — Other Ambulatory Visit: Payer: Self-pay | Admitting: Neurology

## 2015-12-04 NOTE — Telephone Encounter (Signed)
RX for tranxene sent to [facility]. Received a receipt of confirmation.

## 2016-01-04 ENCOUNTER — Other Ambulatory Visit (HOSPITAL_COMMUNITY)
Admission: RE | Admit: 2016-01-04 | Discharge: 2016-01-04 | Disposition: A | Discharge status: Home / Self Care | Payer: BLUE CROSS/BLUE SHIELD | Source: Ambulatory Visit | Attending: Physician Assistant | Admitting: Physician Assistant

## 2016-01-04 ENCOUNTER — Other Ambulatory Visit: Payer: Self-pay | Admitting: Physician Assistant

## 2016-01-04 DIAGNOSIS — Z01411 Encounter for gynecological examination (general) (routine) with abnormal findings: Secondary | ICD-10-CM | POA: Insufficient documentation

## 2016-01-04 DIAGNOSIS — Z1151 Encounter for screening for human papillomavirus (HPV): Secondary | ICD-10-CM | POA: Insufficient documentation

## 2016-01-08 LAB — CYTOLOGY - PAP

## 2016-01-11 ENCOUNTER — Other Ambulatory Visit: Payer: Self-pay | Admitting: Neurology

## 2016-04-30 ENCOUNTER — Encounter: Payer: Self-pay | Admitting: Neurology

## 2016-04-30 ENCOUNTER — Ambulatory Visit (INDEPENDENT_AMBULATORY_CARE_PROVIDER_SITE_OTHER): Payer: BLUE CROSS/BLUE SHIELD | Admitting: Neurology

## 2016-04-30 VITALS — BP 124/60 | HR 76 | Resp 16 | Ht 61.0 in | Wt 185.0 lb

## 2016-04-30 DIAGNOSIS — R0683 Snoring: Secondary | ICD-10-CM | POA: Diagnosis not present

## 2016-04-30 DIAGNOSIS — G40109 Localization-related (focal) (partial) symptomatic epilepsy and epileptic syndromes with simple partial seizures, not intractable, without status epilepticus: Secondary | ICD-10-CM | POA: Insufficient documentation

## 2016-04-30 DIAGNOSIS — Z5181 Encounter for therapeutic drug level monitoring: Secondary | ICD-10-CM | POA: Diagnosis not present

## 2016-04-30 NOTE — Progress Notes (Signed)
[facility]  Provider:  Dr Brett Fairy Referring Provider: Lennie Odor, PA-C Primary Care Physician:  REDMON,NOELLE, PA-C  Chief Complaint  Patient presents with  . Follow-up    Rm 10. No new concerns per patient. No seizures since last visit.      HPI:  Lori Arellano is a 55 y.o. female here as a referral from [facility] .    Lori Arellano's history as a patient of Dr. Erling Cruz and Dr. Johnnye Sima reaches back over 30 years. She has Epilepsy and mild OSA.    The patient began having seizures during her kindergarten year -. The diagnosis was of epilepsy was not made until the second grade of elementary school - at that time she reports hearing voices ( auditory aura ) and  involuntary  "behavior" followed by a complex partial seizure. She further developed secondary generalized seizures after she married and moved to [place].  She returned to [facility] in 1995 and again was followed by Dr. Erling Cruz. 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  A CT scan in 1982  Right after her marriage had showed a small cystic structure as well same location. She recalls that her menstrual cycle was the time she most likely experienced a seizure ( katamenial )   EEG studies in 1991 are normal in the awake state.  On average this patient has suffered approximately one seizure per year but was seizure-free ongoing from 2006.  The auras include tingling of her face or ringing in the ear,  difficulties focusing,  feeling foggy then followed by a complex partial seizure these are as lasted for about 30 seconds some of them were never followed by seizures.   The patient remains on carbamazepine 200 mg twice a day, and seen 3.75 mg 1 tablet twice a day, she also is treated for hypothyroidism 100 mcg of levothyroxine once daily she  takes Vytorin once daily multivitamins once daily and 1200 milligrams of calcium twice a day. She saw her primary neurologist Dr. Erling Cruz in March 2011 and  Last in April 2013 each visit was routine- there was no longer any aura experienced or reported. In her last visit Dr. Erling Cruz  of addressed the possibility that she may suffer from obstructive sleep apnea but she did not want to pursue an evaluation at that time. The patient has continued to gain weight. She occasionally has nights was very poor sleep quality and feels not restored or refreshed in the morning. There is no report of nocturnal seizures. The patient average bedtime is around 11 PM at times it may take her 30 minutes to fall asleep and at others 2 hours.  She rises at 5.45 AM , unrefreshed. Dry mouth, no headaches . She has occasional hot flashes and is in menopause. She may have an average sleep time total of 6 hours . Nocturia is once at night, husband reports loud snoring since 2000, the year she reached a 30 pound weight gain .  She discontinued birth control pills in October 2008,  Had normal liver function tests and thyroid studies in 2011 and 2012. Her  last carbamazepine level in 2013 was  8.4 , in 2012 it was 9.9.  She experiences deja vu with her seizures, and was told before that a cyst in  the left dorso- frontal lobe may have been the focus.   05-16-15  I have met today with Lori Arellano to discuss her recent MRI results of the brain,  interpreted by my colleague Dr. Andrey Spearman  on 12-07-14 , which showed no abnormalities.  The MRI brain was performed with and without contrast. My main interest was to show that there is no mesial temporal sclerosis or hippocampal atrophy. Normal intracranial flow voids for noted the patient had been previously told that she had a small brain cyst this snot foud in the MRI brain. Her Epworth sleepiness score today was 4 points and her fatigue severity score 30 points which are below average.  She's not excessively sleepy right now she's not excessively fatigued.  04-30-2016 Lori Arellano has not experienced any seizures in the last year since the last met. She has been doing very well. She was told that she could wean off carbamazepine but that she probably would need to stop driving for 6 months. Usually, this is a big handicap for a patient that this following an active lifestyle and not retired. Therefore she has continued to take her medications.     Review of Systems: Out of a complete 14 system review, the patient complains of only the following symptoms, and all other reviewed systems are negative.  seizure free since 2006 - no change in meds.   Last seizure was in 1999, after a flu- infection.    Epworth 4, FSS 40  -Snoring, weight gain.  Social History   Social History  . Marital status: Married    Spouse name: N/A  . Number of children: 1  . Years of education: N/A   Occupational History  .      preschool teacher   Social History Main Topics  . Smoking status: Never Smoker  . Smokeless tobacco: Not on file  . Alcohol use 0.0 oz/week     Comment: rare  . Drug use: No  . Sexual activity: Yes   Other Topics Concern  . Not on file   Social History Narrative   Right handed.  Married, one kid.  Caffeine 1 cup avg.  PT preschool teacher.      Family History  Problem Relation Age of Onset  . Diabetes Father     Past Medical History:  Diagnosis Date  . Hyperlipidemia   . Hypersomnia 12/17/2012  . OSA (obstructive sleep apnea)   . Seizures ([place])   . Thyroid condition     No past surgical history on file.  Current Outpatient Prescriptions  Medication Sig Dispense Refill  . calcium carbonate (OS-CAL) 600 MG TABS Take 600 mg by mouth. One tablet in the morning and 1/2 tablet at night    . carbamazepine (TEGRETOL) 200 MG tablet TAKE TWO TABLETS BY MOUTH TWICE DAILY 120 tablet 3  . clorazepate (TRANXENE) 3.75 MG tablet TAKE ONE TABLET BY MOUTH TWICE  DAILY 180 tablet 1  . ezetimibe-simvastatin (VYTORIN) 10-40 MG per tablet Take 1 tablet by mouth at bedtime.    Marland Kitchen levothyroxine (SYNTHROID, LEVOTHROID) 100 MCG tablet Take 100 mcg by mouth daily before breakfast.    . Multiple Vitamin (MULTIVITAMIN) tablet Take 1 tablet by mouth daily.     No current facility-administered medications for this visit.     Allergies as of 04/30/2016  . (No Known Allergies)    Vitals: BP 124/60   Pulse 76   Resp 16   Ht 5' 1"  (1.549 m)   Wt  185 lb (83.9 kg)   BMI 34.96 kg/m  Last Weight:  Wt Readings from Last 1 Encounters:  04/30/16 185 lb (83.9 kg)   Last Height:   Ht Readings from Last 1 Encounters:  04/30/16 5' 1"  (1.549 m)    Physical exam: General: The patient is awake, alert and appears not in acute distress. The patient is well groomed. She is obese.  Head: Normocephalic, atraumatic. Neck is supple. Mallampati 3 -4 , neck circumference: 16 inches, no TMJ, no nasal deviation.  Cardiovascular: Regular rate and rhythm, without  murmurs or carotid bruit, and without distended neck veins. Respiratory: Lungs are clear to auscultation. Skin:  Without evidence of edema, or rash Trunk: BMI is elevated - patient  has normal posture.  Neurologic exam : The patient is awake and alert, oriented to place and time.  Memory subjective  described as intact. There is a normal attention span & concentration ability.Speech is fluent without  dysarthria, dysphonia or aphasia. Mood and affect are appropriate. Cranial nerves:Pupils are equal and briskly reactive to light. Funduscopic exam without  evidence of pallor or edema.Extraocular movements  in vertical and horizontal planes intact , without nystagmus. Visual fields by finger perimetry are intact.  Hearing to finger rub intact. The reported tinnitus is described as bilateral and not interfering with hearing.  Facial sensation intact to fine touch. Facial motor strength is symmetric and tongue and uvula  move midline. Motor exam:   Normal tone and normal muscle bulk , symmetric normal strength in all extremities. Sensory:  Fine touch, pinprick and vibration were tested in all extremities. Proprioception is  Normal. She has reported numbness in the  Ring- and small finger of her left hand, median nerve , she wakes up with tingling numbness, sometimes shaking the hand will help it. Coordination: Rapid alternating movements in the fingers/hands is tested and normal. Finger-to-nose maneuver without evidence of ataxia, dysmetria or tremor.Gait and station: Patient walks without assistive device. Strength within normal limits. Stance is stable and normal. She is able to perform a tandem gait . Turns with 3 steps. Deep tendon reflexes: in the  upper and lower extremities are symmetric and intact. Babinski maneuver response is downgoing.  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 Normal MRI now. Reviewed and discussed.  25 minute visit with full neurological exam.  No change in medication since last seen by Dr. Erling Cruz.   1)Epilepsy since childhood, secondary generalizing ," [place]' March " reported.  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  Last seizure was 1999.  2) mild OSA , loud snoring, morbidly obese at this point. Still not wanting CPAP. Denies sleepiness or fatigue.   Plan:  Treatment plan and additional workup will be reviewed under Problem List.  Continue epilepsy medication as used to this point. Level of CMZ and natrium, CBC. She has not reported ataxia, diplopia,  and has only one new symptom , a pulsating tinnitus, in both ears- that she attributed to elevated BP and stress. Much less common since she changed jobs in Summer 2017.  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   Lori Wulff, Lori Arellano   CC Dr. Barth Kirks Redmon

## 2016-04-30 NOTE — Patient Instructions (Signed)

## 2016-05-01 LAB — COMPREHENSIVE METABOLIC PANEL
A/G RATIO: 1.6 (ref 1.2–2.2)
ALK PHOS: 127 IU/L — AB (ref 39–117)
ALT: 16 IU/L (ref 0–32)
AST: 16 IU/L (ref 0–40)
Albumin: 4.4 g/dL (ref 3.5–5.5)
BUN/Creatinine Ratio: 22 (ref 9–23)
BUN: 14 mg/dL (ref 6–24)
Bilirubin Total: <0.2 mg/dL (ref 0.0–1.2)
CHLORIDE: 103 mmol/L (ref 96–106)
CO2: 27 mmol/L (ref 18–29)
Calcium: 9.5 mg/dL (ref 8.7–10.2)
Creatinine, Ser: 0.64 mg/dL (ref 0.57–1.00)
GFR calc Af Amer: 115 mL/min/{1.73_m2} (ref >59)
GFR calc non Af Amer: 99 mL/min/{1.73_m2} (ref >59)
GLOBULIN, TOTAL: 2.7 g/dL (ref 1.5–4.5)
Glucose: 92 mg/dL (ref 65–99)
POTASSIUM: 4.6 mmol/L (ref 3.5–5.2)
SODIUM: 143 mmol/L (ref 134–144)
Total Protein: 7.1 g/dL (ref 6.0–8.5)

## 2016-05-01 LAB — CBC
Hematocrit: 41.3 % (ref 34.0–46.6)
Hemoglobin: 14.1 g/dL (ref 11.1–15.9)
MCH: 30.1 pg (ref 26.6–33.0)
MCHC: 34.1 g/dL (ref 31.5–35.7)
MCV: 88 fL (ref 79–97)
PLATELETS: 330 10*3/uL (ref 150–379)
RBC: 4.68 x10E6/uL (ref 3.77–5.28)
RDW: 14 % (ref 12.3–15.4)
WBC: 8 10*3/uL (ref 3.4–10.8)

## 2016-05-06 ENCOUNTER — Telehealth: Payer: Self-pay

## 2016-05-06 NOTE — Telephone Encounter (Signed)
I spoke to patient and she is aware of results and recommendations. Patient asked for copy of results to be mailed to her. I confirmed mailing address.

## 2016-05-06 NOTE — Telephone Encounter (Signed)
-----  Message from Larey Seat, MD sent at 05/04/2016 11:37 AM EST ----- Normal metabolic panel, the elevated alk phos is not concerning, but can mean bone density is getting lower.  talk to PCP about bone health. CD

## 2016-05-15 ENCOUNTER — Other Ambulatory Visit: Payer: Self-pay | Admitting: Neurology

## 2016-06-17 ENCOUNTER — Other Ambulatory Visit: Payer: Self-pay | Admitting: Neurology

## 2016-06-18 NOTE — Telephone Encounter (Signed)
RX for tranxene faxed to [facility]. Received a receipt of confirmation.

## 2016-07-09 ENCOUNTER — Other Ambulatory Visit (HOSPITAL_COMMUNITY)
Admission: RE | Admit: 2016-07-09 | Discharge: 2016-07-09 | Disposition: A | Discharge status: Home / Self Care | Payer: BLUE CROSS/BLUE SHIELD | Source: Ambulatory Visit | Attending: Family Medicine | Admitting: Family Medicine

## 2016-07-09 ENCOUNTER — Other Ambulatory Visit: Payer: Self-pay | Admitting: Physician Assistant

## 2016-07-09 DIAGNOSIS — R87610 Atypical squamous cells of undetermined significance on cytologic smear of cervix (ASC-US): Secondary | ICD-10-CM | POA: Diagnosis present

## 2016-07-10 LAB — CYTOLOGY - PAP: DIAGNOSIS: NEGATIVE

## 2017-01-03 ENCOUNTER — Other Ambulatory Visit: Payer: Self-pay | Admitting: Neurology

## 2017-05-01 ENCOUNTER — Ambulatory Visit: Payer: BLUE CROSS/BLUE SHIELD | Admitting: Neurology

## 2017-07-31 ENCOUNTER — Ambulatory Visit: Payer: BLUE CROSS/BLUE SHIELD | Admitting: Neurology

## 2017-07-31 ENCOUNTER — Encounter: Payer: Self-pay | Admitting: Neurology

## 2017-07-31 VITALS — BP 128/77 | HR 98 | Ht 61.0 in | Wt 191.0 lb

## 2017-07-31 DIAGNOSIS — Z79899 Other long term (current) drug therapy: Secondary | ICD-10-CM

## 2017-07-31 DIAGNOSIS — G40001 Localization-related (focal) (partial) idiopathic epilepsy and epileptic syndromes with seizures of localized onset, not intractable, with status epilepticus: Secondary | ICD-10-CM

## 2017-07-31 DIAGNOSIS — G4719 Other hypersomnia: Secondary | ICD-10-CM | POA: Diagnosis not present

## 2017-07-31 MED ORDER — CLORAZEPATE DIPOTASSIUM 3.75 MG PO TABS: 3.7500 mg | ORAL_TABLET | Freq: Two times a day (BID) | ORAL | 1 refills | Status: DC

## 2017-07-31 MED ORDER — CARBAMAZEPINE 200 MG PO TABS: 400.0000 mg | ORAL_TABLET | Freq: Two times a day (BID) | ORAL | 3 refills | Status: DC

## 2017-07-31 NOTE — Progress Notes (Signed)
[facility]  Provider:  Dr Brett Fairy Referring Provider: Lennie Odor, PA-C Primary Care Physician:  Lennie Odor, PA-C  Chief Complaint  Patient presents with  . Follow-up    pt alone, rm 11 pt denies sz activity    HPI:  Lori Arellano is a 57 y.o. female here as a referral from [facility] .    Lori Arellano's history as a patient of Dr. Erling Cruz and Dr. Johnnye Sima reaches back over 30 years. She has Epilepsy and mild OSA.    The patient began having seizures during her kindergarten year -. The diagnosis was of epilepsy was not made until the second grade of elementary school - at that time she reports hearing voices ( auditory aura ) and  involuntary  "behavior" followed by a complex partial seizure. She further developed secondary generalized seizures after she married and moved to [place].  She returned to [place] in 1995 and again was followed by Dr. Erling Cruz. 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  A CT scan in 1982  Right after her marriage had showed a small cystic structure as well same location. She recalls that her menstrual cycle was the time she most likely experienced a seizure ( katamenial )   EEG studies in 1991 are normal in the awake state.  On average this patient has suffered approximately one seizure per year but was seizure-free ongoing from 2006.  The auras include tingling of her face or ringing in the ear,  difficulties focusing,  feeling foggy then followed by a complex partial seizure these are as lasted for about 30 seconds some of them were never followed by seizures.   The patient remains on carbamazepine 200 mg twice a day, and seen 3.75 mg 1 tablet twice a day, she also is treated for hypothyroidism 100 mcg of levothyroxine once daily she takes Vytorin once daily  multivitamins once daily and 1200 milligrams of calcium twice a day. She saw her primary neurologist Dr. Erling Cruz in March 2011 and  Last in April 2013 each visit was routine- there was no longer any aura experienced or reported. In her last visit Dr. Erling Cruz  of addressed the possibility that she may suffer from obstructive sleep apnea but she did not want to pursue an evaluation at that time. The patient has continued to gain weight. She occasionally has nights was very poor sleep quality and feels not restored or refreshed in the morning. There is no report of nocturnal seizures. The patient average bedtime is around 11 PM at times it may take her 30 minutes to fall asleep and at others 2 hours.  She rises at 5.45 AM , unrefreshed. Dry mouth, no headaches . She has occasional hot flashes and is in menopause. She may have an average sleep time total of 6 hours . Nocturia is once at night, husband reports loud snoring since 2000, the year she reached a 30 pound weight gain .  She discontinued birth control pills in October 2008,  Had normal liver function tests and thyroid studies in 2011 and 2012. Her  last carbamazepine level in 2013 was  8.4 , in 2012 it was 9.9.  She experiences deja vu with her seizures, and was told before that a cyst in the left dorso- frontal lobe  may have been the focus.   05-16-15  I have met today with Lori Arellano to discuss her recent MRI results of the brain,  interpreted by my colleague Dr. Andrey Spearman  on 12-07-14 , which showed no abnormalities.  The MRI brain was performed with and without contrast. My main interest was to show that there is no mesial temporal sclerosis or hippocampal atrophy. Normal intracranial flow voids for noted the patient had been previously told that she had a small brain cyst this snot foud in the MRI brain. Her Epworth sleepiness score today was 4 points and her fatigue severity score 30 points which are below average. She's not excessively sleepy  right now she's not excessively fatigued.  04-30-2016 Lori Arellano has not experienced any seizures in the last year since the last met. She has been doing very well. She was told that she could wean off carbamazepine but that she probably would need to stop driving for 6 months. Usually, this is a big handicap for a patient that this following an active lifestyle and not retired. Therefore she has continued to take her medications.  07-31-2017, Patient needs refills. No new neurologic complaints, unchanged exam.    Review of Systems: Out of a complete 14 system review, the patient complains of only the following symptoms, and all other reviewed systems are negative.  seizure free since 2006 - no change in meds.   Last seizure was in 1999, after a flu- infection.    How likely are you to doze in the following situations: 0 = not likely, 1 = slight chance, 2 = moderate chance, 3 = high chance  Sitting and Reading? 2 Watching Television? 1 Sitting inactive in a public place (theater or meeting)? 1 Lying down in the afternoon when circumstances permit? 2 Sitting and talking to someone? 0 Sitting quietly after lunch without alcohol?1 In a car, while stopped for a few minutes in traffic?0 As a passenger in a car for an hour without a break?0  Total = 7   FSS 40  -Snoring, weight gain.  Social History   Socioeconomic History  . Marital status: Married    Spouse name: Not on file  . Number of children: 1  . Years of education: Not on file  . Highest education level: Not on file  Social Needs  . Financial resource strain: Not on file  . Food insecurity - worry: Not on file  . Food insecurity - inability: Not on file  . Transportation needs - medical: Not on file  . Transportation needs - non-medical: Not on file  Occupational History    Comment: preschool teacher  Tobacco Use  . Smoking status: Never Smoker  . Smokeless tobacco: Never Used  Substance and Sexual Activity  .  Alcohol use: Yes    Alcohol/week: 0.0 oz    Comment: rare  . Drug use: No  . Sexual activity: Yes  Other Topics Concern  . Not on file  Social History Narrative   Right handed.  Married, one kid.  Caffeine 1 cup avg.  PT preschool teacher.      Family History  Problem Relation Age of Onset  . Diabetes Father     Past Medical History:  Diagnosis Date  . Hyperlipidemia   . Hypersomnia 12/17/2012  . OSA (obstructive sleep apnea)   . Seizures ([place])   . Thyroid condition     No past surgical history on file.  Current Outpatient Medications  Medication Sig Dispense  Refill  . calcium carbonate (OS-CAL) 600 MG TABS Take 600 mg by mouth. One tablet in the morning and 1/2 tablet at night    . carbamazepine (TEGRETOL) 200 MG tablet TAKE TWO TABLETS BY MOUTH TWICE DAILY 360 tablet 3  . clorazepate (TRANXENE) 3.75 MG tablet TAKE ONE TABLET BY MOUTH TWICE DAILY 180 tablet 1  . ezetimibe-simvastatin (VYTORIN) 10-40 MG per tablet Take 1 tablet by mouth at bedtime.    Marland Kitchen levothyroxine (SYNTHROID, LEVOTHROID) 100 MCG tablet Take 100 mcg by mouth daily before breakfast.    . Multiple Vitamin (MULTIVITAMIN) tablet Take 1 tablet by mouth daily.    . rosuvastatin (CRESTOR) 20 MG tablet   0   No current facility-administered medications for this visit.     Allergies as of 07/31/2017  . (No Known Allergies)    Vitals: BP 128/77   Pulse 98   Ht _0  (1.549 m)   Wt 191 lb (86.6 kg)   BMI 36.09 kg/m  Last Weight:  Wt Readings from Last 1 Encounters:  07/31/17 191 lb (86.6 kg)   Last Height:   Ht Readings from Last 1 Encounters:  07/31/17 _1  (1.549 m)    Physical exam: General: The patient is awake, alert and appears not in acute distress. The patient is well groomed. She is obese.  Head: Normocephalic, atraumatic. Neck is supple. Mallampati 3 -4 , neck circumference: 16 inches, no TMJ, no nasal deviation.  Cardiovascular: Regular rate and rhythm, without  murmurs or carotid  bruit, and without distended neck veins. Respiratory: Lungs are clear to auscultation. Skin:  Without evidence of edema, or rash Trunk: BMI is elevated - patient  has normal posture.  Neurologic exam : The patient is awake and alert, oriented to place and time.  Memory subjective  described as intact. There is a normal attention span & concentration ability.Speech is fluent without  dysarthria, dysphonia or aphasia. Mood and affect are appropriate. Cranial nerves:Pupils are equal and briskly reactive to light. Funduscopic exam without  evidence of pallor or edema.Extraocular movements  in vertical and horizontal planes intact , without nystagmus. Visual fields by finger perimetry are intact.  Hearing to finger rub intact. The reported tinnitus is described as bilateral and not interfering with hearing.  Facial sensation intact to fine touch. Facial motor strength is symmetric and tongue and uvula move midline. Motor exam:   Normal tone and normal muscle bulk , symmetric normal strength in all extremities. Sensory:  Fine touch, pinprick and vibration were tested in all extremities. Proprioception is  Normal. She has reported numbness in the  Ring- and small finger of her left hand, median nerve , she wakes up with tingling numbness, sometimes shaking the hand will help it. Coordination: Rapid alternating movements in the fingers/hands is tested and normal. Finger-to-nose maneuver without evidence of ataxia, dysmetria or tremor.Gait and station: Patient walks without assistive device. Strength within normal limits. Stance is stable and normal. She is able to perform a tandem gait . Turns with 3 steps. Deep tendon reflexes: in the  upper and lower extremities are symmetric and intact. Babinski maneuver response is downgoing.  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 Normal MRI now. Reviewed and discussed.  25 minute visit with full neurological exam.  No change in  medication since last seen by Dr. Erling Cruz.   1)Epilepsy since childhood, secondary generalizing ," Jacksonian' March " reported.  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  Last seizure was 1999.  2) mild OSA , loud snoring, morbidly obese at this point. Still not wanting CPAP. Denies sleepiness or fatigue.   Plan:  Treatment plan and additional workup will be reviewed under Problem List.  Continue epilepsy medication as used to this point. Level of CMZ and natrium, CBC. She has not reported ataxia, diplopia,  and has only one new symptom , a pulsating tinnitus, in both ears- that she attributed to elevated BP and stress. Much less common since she changed jobs in Summer 2017.  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   Larey Seat, MD   [facility]  Provider:  Dr Wrenn Willcox Referring Provider: Lennie Odor, PA-C Primary Care Physician:  Lennie Odor, PA-C  Chief Complaint  Patient presents with  . Follow-up    pt alone, rm 11 pt denies sz activity    HPI:  Lori Arellano is a 57 y.o. female here as a referral from [facility] .    Mrs. Flow's history as a patient of Dr. Erling Cruz and Dr. Johnnye Sima reaches back over 30 years. She has Epilepsy and mild OSA.    The patient began having seizures during her kindergarten year -. The diagnosis was of epilepsy was not made until the second grade of elementary school - at that time she reports hearing voices ( auditory aura ) and  involuntary  "behavior" followed by a complex partial seizure. She further developed secondary generalized seizures after she married and moved to [place].  She returned  to [facility] in 1995 and again was followed by Dr. Erling Cruz. 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  A CT scan in 1982  Right after her marriage had showed a small cystic structure as well same location. She recalls that her menstrual cycle was the time she most likely experienced a seizure ( katamenial )   EEG studies in 1991 are normal in the awake state.  On average this patient has suffered approximately one seizure per year but was seizure-free ongoing from 2006.  The auras include tingling of her face or ringing in the ear,  difficulties focusing,  feeling foggy then followed by a complex partial seizure these are as lasted for about 30 seconds some of them were never followed by seizures.   The patient remains on carbamazepine 200 mg twice a day, and seen 3.75 mg 1 tablet twice a day, she also is treated for hypothyroidism 100 mcg of levothyroxine once daily she takes Vytorin once daily multivitamins once daily and 1200 milligrams of calcium twice a day. She saw her primary neurologist Dr. Erling Cruz in March 2011 and  Last in April 2013 each visit was routine- there was no longer any aura experienced or reported. In her last visit Dr. Erling Cruz  of addressed the possibility that she may suffer from obstructive sleep apnea but she did not want to pursue an evaluation at that time.  The patient has continued to gain weight. She occasionally has nights was very poor sleep quality and feels not restored or refreshed in the morning. There is no report of nocturnal seizures. The patient average bedtime is around 11 PM at times it may take her 30 minutes to fall asleep and at others 2 hours.  She rises at 5.45 AM , unrefreshed. Dry mouth, no headaches . She has occasional hot flashes and is in menopause. She may have an average sleep time  total of 6 hours . Nocturia is once at night, husband reports loud snoring since 2000, the year she reached a 30 pound weight gain .  She discontinued birth control pills in October 2008,  Had normal liver function tests and thyroid studies in 2011 and 2012. Her  last carbamazepine level in 2013 was  8.4 , in 2012 it was 9.9.  She experiences deja vu with her seizures, and was told before that a cyst in the left dorso- frontal lobe may have been the focus.   05-16-15  I have met today with Mrs. Devos to discuss her recent MRI results of the brain,  interpreted by my colleague Dr. Andrey Spearman  on 12-07-14 , which showed no abnormalities.  The MRI brain was performed with and without contrast. My main interest was to show that there is no mesial temporal sclerosis or hippocampal atrophy. Normal intracranial flow voids for noted the patient had been previously told that she had a small brain cyst this snot foud in the MRI brain. Her Epworth sleepiness score today was 4 points and her fatigue severity score 30 points which are below average. She's not excessively sleepy right now she's not excessively fatigued.  04-30-2016 Mrs. Shiffman has not experienced any seizures in the last year since the last met. She has been doing very well. She was told that she could wean off carbamazepine but that she probably would need to stop driving for 6 months. Usually, this is a big handicap for a patient that this following an active lifestyle and not retired. Therefore she has continued to take her medications.     Review of Systems: Out of a complete 14 system review, the patient complains of only the following symptoms, and all other reviewed systems are negative.  seizure free since 2006 - no change in meds.   Last seizure was in 1999, after a flu- infection.    Epworth  FSS 40  -Snoring, weight gain.  Social History   Socioeconomic History  . Marital status: Married    Spouse name: Not on file  .  Number of children: 1  . Years of education: Not on file  . Highest education level: Not on file  Social Needs  . Financial resource strain: Not on file  . Food insecurity - worry: Not on file  . Food insecurity - inability: Not on file  . Transportation needs - medical: Not on file  . Transportation needs - non-medical: Not on file  Occupational History    Comment: preschool teacher  Tobacco Use  . Smoking status: Never Smoker  . Smokeless tobacco: Never Used  Substance and Sexual Activity  . Alcohol use: Yes    Alcohol/week: 0.0 oz    Comment: rare  . Drug use: No  . Sexual activity: Yes  Other Topics Concern  . Not on file  Social History Narrative   Right handed.  Married, one kid.  Caffeine 1 cup avg.  PT preschool teacher.  Family History  Problem Relation Age of Onset  . Diabetes Father     Past Medical History:  Diagnosis Date  . Hyperlipidemia   . Hypersomnia 12/17/2012  . OSA (obstructive sleep apnea)   . Seizures ([place])   . Thyroid condition     No past surgical history on file.  Current Outpatient Medications  Medication Sig Dispense Refill  . calcium carbonate (OS-CAL) 600 MG TABS Take 600 mg by mouth. One tablet in the morning and 1/2 tablet at night    . carbamazepine (TEGRETOL) 200 MG tablet TAKE TWO TABLETS BY MOUTH TWICE DAILY 360 tablet 3  . clorazepate (TRANXENE) 3.75 MG tablet TAKE ONE TABLET BY MOUTH TWICE DAILY 180 tablet 1  . ezetimibe-simvastatin (VYTORIN) 10-40 MG per tablet Take 1 tablet by mouth at bedtime.    Marland Kitchen levothyroxine (SYNTHROID, LEVOTHROID) 100 MCG tablet Take 100 mcg by mouth daily before breakfast.    . Multiple Vitamin (MULTIVITAMIN) tablet Take 1 tablet by mouth daily.    . rosuvastatin (CRESTOR) 20 MG tablet   0   No current facility-administered medications for this visit.     Allergies as of 07/31/2017  . (No Known Allergies)    Vitals: BP 128/77   Pulse 98   Ht _0  (1.549 m)   Wt 191 lb (86.6 kg)   BMI  36.09 kg/m  Last Weight:  Wt Readings from Last 1 Encounters:  07/31/17 191 lb (86.6 kg)   Last Height:   Ht Readings from Last 1 Encounters:  07/31/17 _1  (1.549 m)    Physical exam: General: The patient is awake, alert and appears not in acute distress. The patient is well groomed. She is obese.  Head: Normocephalic, atraumatic. Neck is supple. Mallampati 3 -4 , neck circumference: 16 inches, no TMJ, no nasal deviation.  Cardiovascular: Regular rate and rhythm, without  murmurs or carotid bruit, and without distended neck veins. Respiratory: Lungs are clear to auscultation. Skin:  Without evidence of edema, or rash Trunk: BMI is elevated - patient  has normal posture.  Neurologic exam : The patient is awake and alert, oriented to place and time.  Memory subjective  described as intact. There is a normal attention span & concentration ability.Speech is fluent without  dysarthria, dysphonia or aphasia. Mood and affect are appropriate. Cranial nerves:Pupils are equal and briskly reactive to light. Funduscopic exam without  evidence of pallor or edema.Extraocular movements  in vertical and horizontal planes intact , without nystagmus. Visual fields by finger perimetry are intact.  Hearing to finger rub intact. The reported tinnitus is described as bilateral and not interfering with hearing.  Facial sensation intact to fine touch. Facial motor strength is symmetric and tongue and uvula move midline. Motor exam:   Normal tone and normal muscle bulk , symmetric normal strength in all extremities. Sensory:  Fine touch, pinprick and vibration were tested in all extremities. Proprioception is  Normal. She has reported numbness in the  Ring- and small finger of her left hand, median nerve , she wakes up with tingling numbness, sometimes shaking the hand will help it. Coordination: Rapid alternating movements in the fingers/hands is tested and normal. Finger-to-nose maneuver without evidence of  ataxia, dysmetria or tremor.Gait and station: Patient walks without assistive device. Strength within normal limits. Stance is stable and normal. She is able to perform a tandem gait . Turns with 3 steps. Deep tendon reflexes: in the  upper and lower extremities are symmetric and intact. Babinski  maneuver response is downgoing.  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 Normal MRI now. Reviewed and discussed.  25 minute visit with full neurological exam.  No change in medication since last seen by Dr. Erling Cruz.   1)Epilepsy since childhood, secondary generalizing ," Jacksonian' March " reported.  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  Last seizure was 1999.  2) mild OSA , loud snoring, morbidly obese at this point. Still not wanting CPAP. Denies sleepiness or fatigue.   Plan:  Treatment plan and additional workup will be reviewed under Problem List.  Continue epilepsy medication as used to this point. Level of CMZ ( tegretol )  and natrium, CBC. She has not reported ataxia, diplopia, or confusion.      Larey Seat, MD   07-31-2017   CC Dr. Barth Kirks [place]          H

## 2017-08-19 ENCOUNTER — Other Ambulatory Visit: Payer: Self-pay | Admitting: Physician Assistant

## 2017-08-19 DIAGNOSIS — Z139 Encounter for screening, unspecified: Secondary | ICD-10-CM

## 2017-08-21 ENCOUNTER — Encounter: Payer: Self-pay | Admitting: Radiology

## 2017-08-21 ENCOUNTER — Ambulatory Visit
Admission: RE | Admit: 2017-08-21 | Discharge: 2017-08-21 | Disposition: A | Discharge status: Home / Self Care | Payer: BLUE CROSS/BLUE SHIELD | Source: Ambulatory Visit | Attending: Physician Assistant | Admitting: Physician Assistant

## 2017-08-21 DIAGNOSIS — Z139 Encounter for screening, unspecified: Secondary | ICD-10-CM

## 2018-05-01 ENCOUNTER — Other Ambulatory Visit: Payer: Self-pay | Admitting: Neurology

## 2018-05-18 ENCOUNTER — Ambulatory Visit
Admission: EM | Admit: 2018-05-18 | Discharge: 2018-05-18 | Disposition: A | Discharge status: Home / Self Care | Payer: BLUE CROSS/BLUE SHIELD | Attending: Family Medicine | Admitting: Family Medicine

## 2018-05-18 ENCOUNTER — Encounter: Payer: Self-pay | Admitting: Emergency Medicine

## 2018-05-18 DIAGNOSIS — J069 Acute upper respiratory infection, unspecified: Secondary | ICD-10-CM | POA: Diagnosis not present

## 2018-05-18 MED ORDER — FLUTICASONE PROPIONATE 50 MCG/ACT NA SUSP: 2.0000 | Freq: Every day | NASAL | 2 refills | Status: AC

## 2018-05-18 MED ORDER — PREDNISONE 20 MG PO TABS: 20.0000 mg | ORAL_TABLET | Freq: Two times a day (BID) | ORAL | 0 refills | Status: AC

## 2018-05-18 NOTE — ED Notes (Signed)
Patient able to ambulate independently  

## 2018-05-18 NOTE — Discharge Instructions (Signed)
Get plenty of rest Push fluids Take prednisone twice a day for 5 days.  Take 2 doses today. Use Flonase once a day until your symptoms improve Return if you fail to see improvement in 7 to 10 days, or if you have increasing fever or worsening symptoms over time

## 2018-05-18 NOTE — ED Triage Notes (Signed)
[facility] congestion, cough, post-nasal drip, and facial pain.

## 2018-05-18 NOTE — ED Provider Notes (Signed)
[facility]    CSN: [id number] Arrival date & time: 05/18/18  1053     History   Chief Complaint Chief Complaint  Patient presents with  . Cough  . Nasal Congestion    HPI Otho BellowsCynthia M Shrider is a 57 y.o. female.   HPI  Patient has had 3 to 4 days of cold symptoms.  Sinus pressure and pain, postnasal drip, mild coughing from the mucus.  No ear pressure pain.  No fever or chills.  No sore throat.  Mild headache.  No chest congestion or chest pain. Patient states she has had her flu shot No known exposure to flu or pneumonia She does have some underlying allergies She has a history of seizure disorder is compliant with her medication.  Past Medical History:  Diagnosis Date  . Hyperlipidemia   . Hypersomnia 12/17/2012  . OSA (obstructive sleep apnea)   . Seizures (HCC)   . Thyroid condition     Patient Active Problem List   Diagnosis Date Noted  . Encounter for medication management 07/31/2017  . Medication monitoring encounter 04/30/2016  . Focal (motor) epilepsy (HCC) 04/30/2016  . Snoring 04/30/2016  . Morbid obesity (HCC) 04/30/2016  . Partial epilepsy secondarily generalized 05/16/2015  . OSA (obstructive sleep apnea) 04/28/2014  . Hypersomnia 12/17/2012  . Generalized convulsive epilepsy without mention of intractable epilepsy 10/09/2012  . Localization-related (focal) (partial) epilepsy and epileptic syndromes with complex partial seizures, with intractable epilepsy 10/09/2012    History reviewed. No pertinent surgical history.  OB History   No obstetric history on file.      Home Medications    Prior to Admission medications   Medication Sig Start Date End Date Taking? Authorizing Provider  calcium carbonate (OS-CAL) 600 MG TABS Take 600 mg by mouth. One tablet in the morning and 1/2 tablet at night   Yes [provider]  carbamazepine (TEGRETOL) 200 MG tablet Take 2 tablets (400 mg total) by mouth 2 (two) times daily. 07/31/17  Yes  Dohmeier, Porfirio Mylararmen, MD  clorazepate (TRANXENE) 3.75 MG tablet TAKE 1 TABLET BY MOUTH TWICE DAILY 05/04/18  Yes Dohmeier, Porfirio Mylararmen, MD  ezetimibe-simvastatin (VYTORIN) 10-40 MG per tablet Take 1 tablet by mouth at bedtime.   Yes [provider]  levothyroxine (SYNTHROID, LEVOTHROID) 100 MCG tablet Take 100 mcg by mouth daily before breakfast.   Yes [provider]  Multiple Vitamin (MULTIVITAMIN) tablet Take 1 tablet by mouth daily.   Yes [provider]  rosuvastatin (CRESTOR) 20 MG tablet  04/24/17  Yes [provider]  fluticasone (FLONASE) 50 MCG/ACT nasal spray Place 2 sprays into both nostrils daily. 05/18/18   Eustace MooreNelson, Wil Slape Sue, MD  predniSONE (DELTASONE) 20 MG tablet Take 1 tablet (20 mg total) by mouth 2 (two) times daily with a meal. 05/18/18   Eustace MooreNelson, Dennard Vezina Sue, MD    Family History Family History  Problem Relation Age of Onset  . Diabetes Father     Social History Social History   Tobacco Use  . Smoking status: Never Smoker  . Smokeless tobacco: Never Used  Substance Use Topics  . Alcohol use: Yes    Alcohol/week: 0.0 standard drinks    Comment: rare  . Drug use: No     Allergies   Patient has no known allergies.   Review of Systems Review of Systems  Constitutional: Negative for chills, fatigue and fever.  HENT: Positive for dental problem, postnasal drip, rhinorrhea and sinus pressure. Negative for ear pain, sinus  pain and sore throat.   Eyes: Negative for pain and visual disturbance.  Respiratory: Negative for cough and shortness of breath.   Cardiovascular: Negative for chest pain and palpitations.  Gastrointestinal: Negative for abdominal pain and vomiting.  Genitourinary: Negative for dysuria and hematuria.  Musculoskeletal: Negative for arthralgias and back pain.  Skin: Negative for color change and rash.  Neurological: Negative for seizures and syncope.  Psychiatric/Behavioral: Negative for sleep disturbance.  All  other systems reviewed and are negative.    Physical Exam Triage Vital Signs ED Triage Vitals [05/18/18 1057]  Enc Vitals Group     BP 117/81     Pulse Rate 87     Resp 18     Temp (!) 97.5 F (36.4 C)     Temp Source Oral     SpO2 96 %   No data found.  Updated Vital Signs BP 117/81 (BP Location: Left Arm)   Pulse 87   Temp (!) 97.5 F (36.4 C) (Oral)   Resp 18   SpO2 96%      Physical Exam Constitutional:      General: She is not in acute distress.    Appearance: She is well-developed.  HENT:     Head: Normocephalic and atraumatic.     Right Ear: Tympanic membrane and ear canal normal.     Left Ear: Tympanic membrane and ear canal normal.     Nose: Congestion and rhinorrhea present.     Mouth/Throat:     Mouth: Mucous membranes are moist.     Pharynx: Oropharynx is clear.  Eyes:     Conjunctiva/sclera: Conjunctivae normal.     Pupils: Pupils are equal, round, and reactive to light.  Neck:     Musculoskeletal: Normal range of motion.  Cardiovascular:     Rate and Rhythm: Normal rate and regular rhythm.     Heart sounds: Normal heart sounds.  Pulmonary:     Effort: Pulmonary effort is normal. No respiratory distress.     Breath sounds: Normal breath sounds.  Abdominal:     General: There is no distension.     Palpations: Abdomen is soft.  Musculoskeletal: Normal range of motion.  Lymphadenopathy:     Cervical: No cervical adenopathy.  Skin:    General: Skin is warm and dry.  Neurological:     General: No focal deficit present.     Mental Status: She is alert. Mental status is at baseline.      UC Treatments / Results  Labs (all labs ordered are listed, but only abnormal results are displayed) Labs Reviewed - No data to display  EKG None  Radiology No results found.  Procedures Procedures (including critical care time)  Medications Ordered in UC Medications - No data to display  Initial Impression / Assessment and Plan / UC Course  I  have reviewed the triage vital signs and the nursing notes.  Pertinent labs & imaging results that were available during my care of the patient were reviewed by me and considered in my medical decision making (see chart for details).     No sinus pressure pain.  She does have nasal congestion symptoms.  When you are the most sinus infection symptoms are caused by viruses.  Antibiotics will not help her get better faster.  She is happy with symptomatic care. Final Clinical Impressions(s) / UC Diagnoses   Final diagnoses:  Viral upper respiratory tract infection     Discharge Instructions  Get plenty of rest Push fluids Take prednisone twice a day for 5 days.  Take 2 doses today. Use Flonase once a day until your symptoms improve Return if you fail to see improvement in 7 to 10 days, or if you have increasing fever or worsening symptoms over time   ED Prescriptions    Medication Sig Dispense Auth. Provider   fluticasone (FLONASE) 50 MCG/ACT nasal spray Place 2 sprays into both nostrils daily. 16 g Eustace Moore, MD   predniSONE (DELTASONE) 20 MG tablet Take 1 tablet (20 mg total) by mouth 2 (two) times daily with a meal. 10 tablet Eustace Moore, MD     Controlled Substance Prescriptions [place] Controlled Substance Registry consulted? Not Applicable   Eustace Moore, MD 05/18/18 1145

## 2018-08-04 ENCOUNTER — Ambulatory Visit: Payer: BLUE CROSS/BLUE SHIELD | Admitting: Adult Health

## 2018-08-20 ENCOUNTER — Other Ambulatory Visit: Payer: Self-pay | Admitting: Neurology

## 2018-09-28 ENCOUNTER — Telehealth: Payer: Self-pay

## 2018-09-28 NOTE — Telephone Encounter (Signed)
Spoke with the patient and she stated that her insurance has changed and that she now has a new neurologist.

## 2018-10-01 ENCOUNTER — Ambulatory Visit: Payer: BLUE CROSS/BLUE SHIELD | Admitting: Adult Health

## 2019-01-14 IMAGING — MG DIGITAL SCREENING BILATERAL MAMMOGRAM WITH TOMO AND CAD
6 of 10 series · 6 of 30 positions shown · non-contrast
Comparison: Previous exam(s).

CLINICAL DATA: Screening.

EXAM:
DIGITAL SCREENING BILATERAL MAMMOGRAM WITH TOMO AND CAD

[R CC synth-2D]
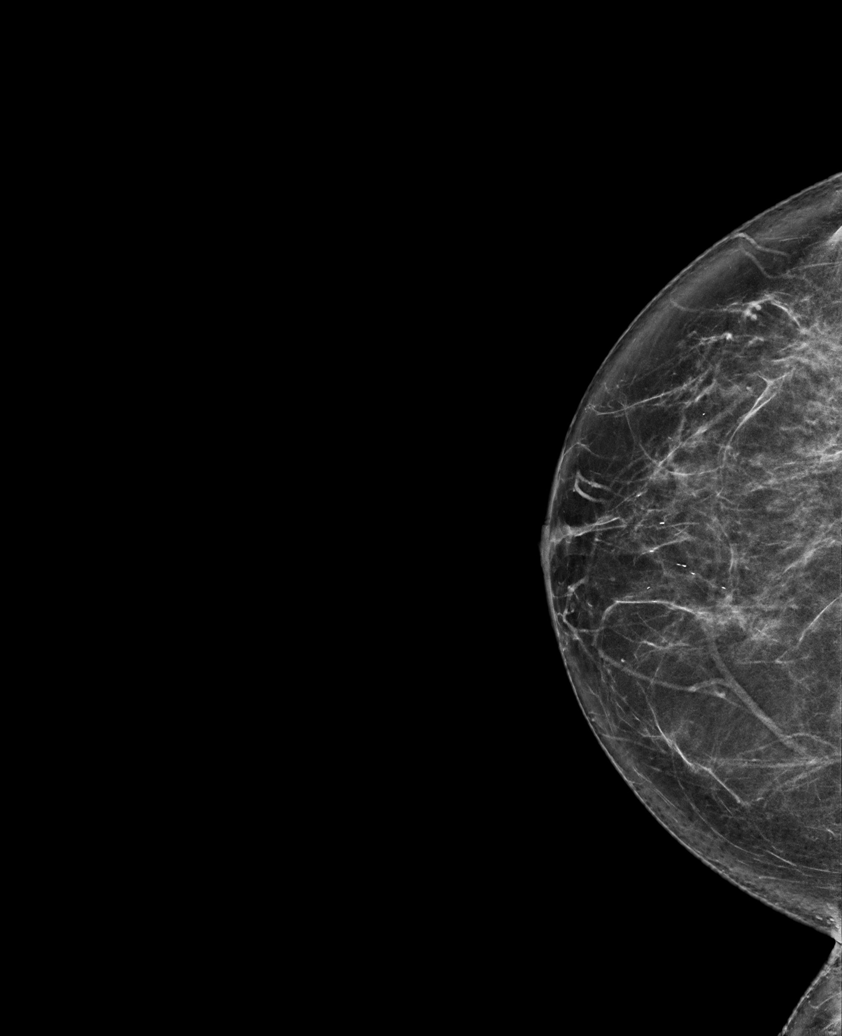

[L MLO synth-2D]
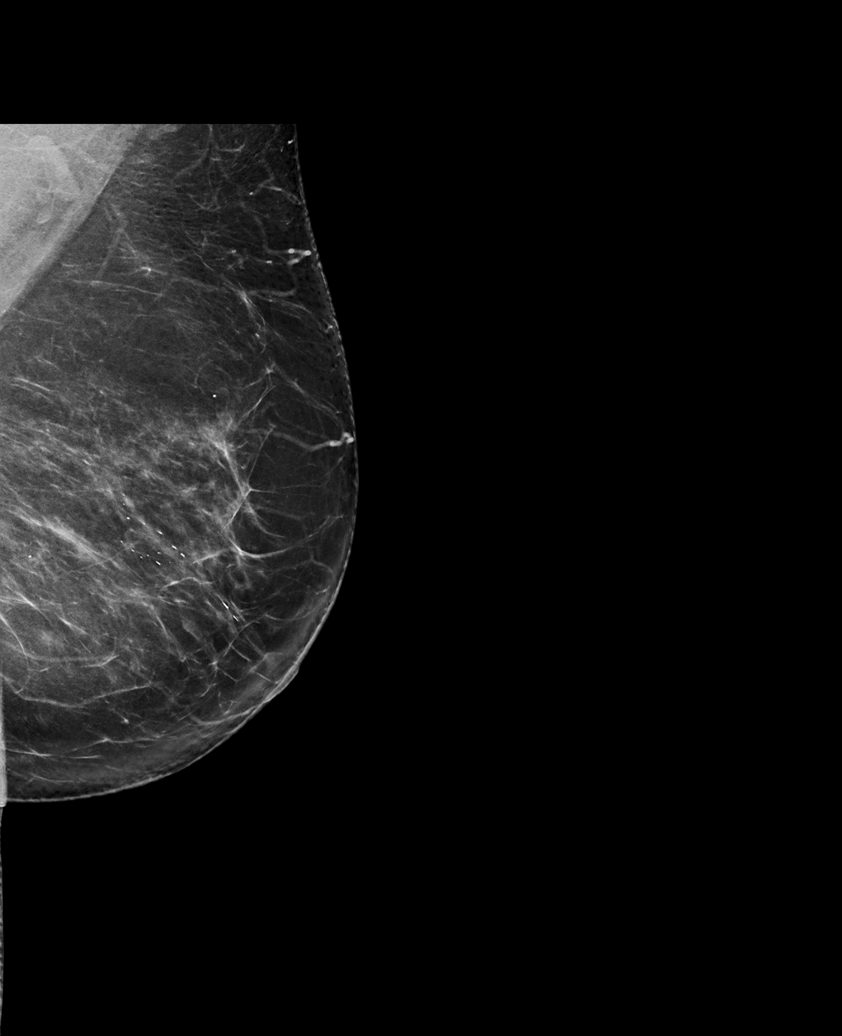

[R MLO synth-2D (1 of 2)]
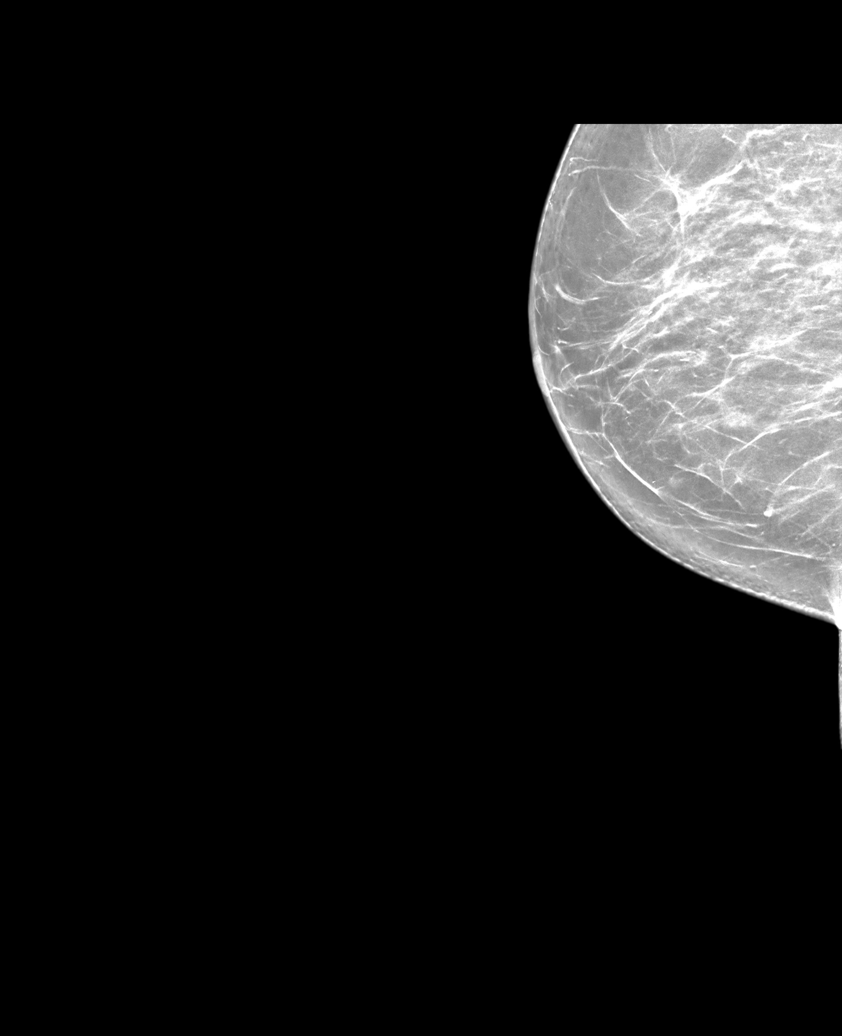

[R MLO synth-2D (2 of 2)]
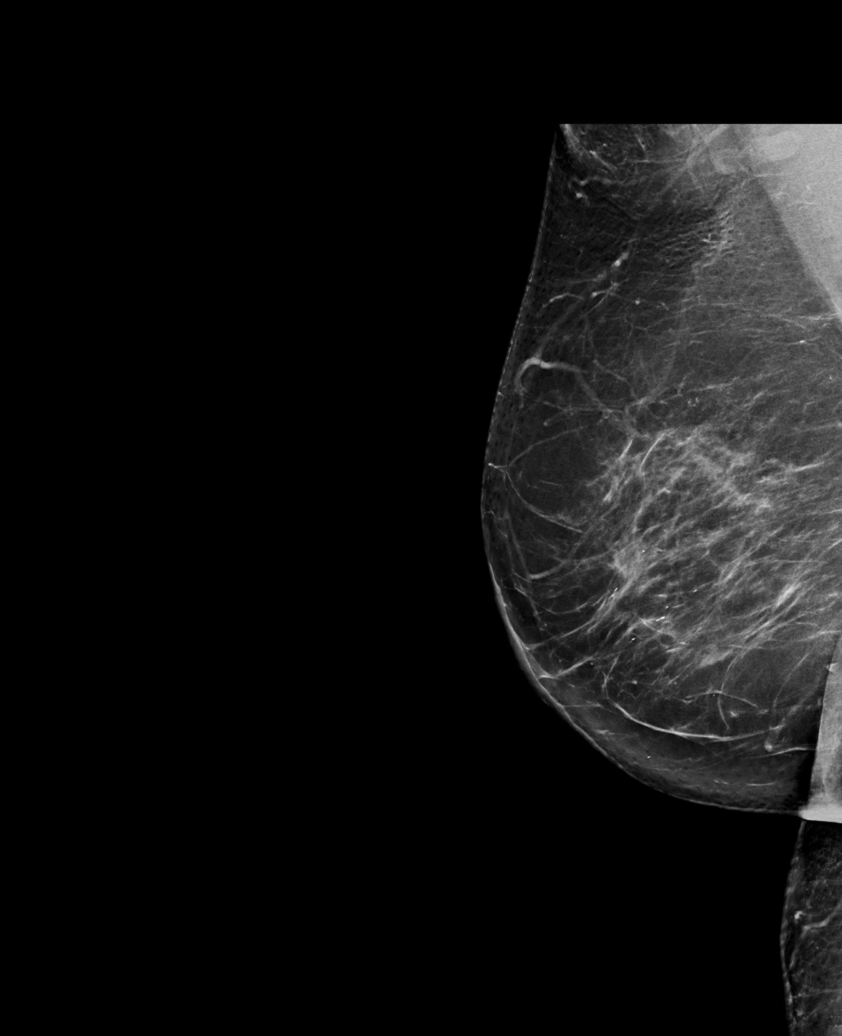

[L CC synth-2D]
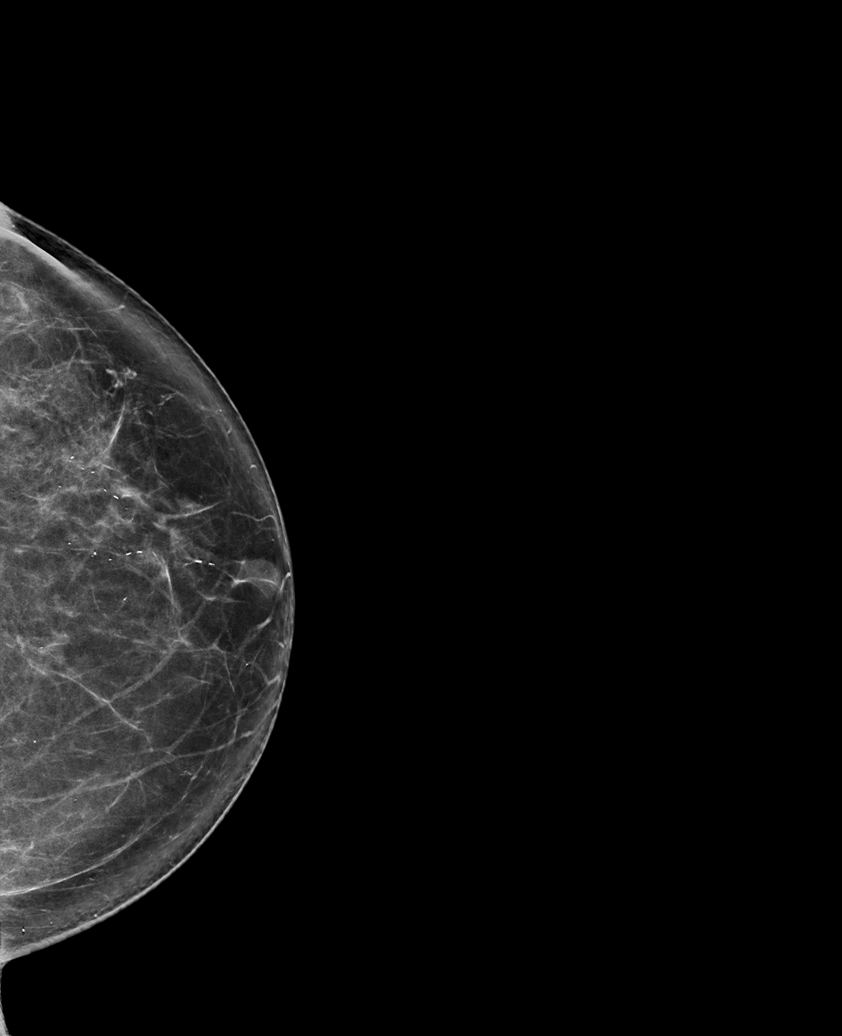

[R MLO tomo · tomo slice 39/77.0]
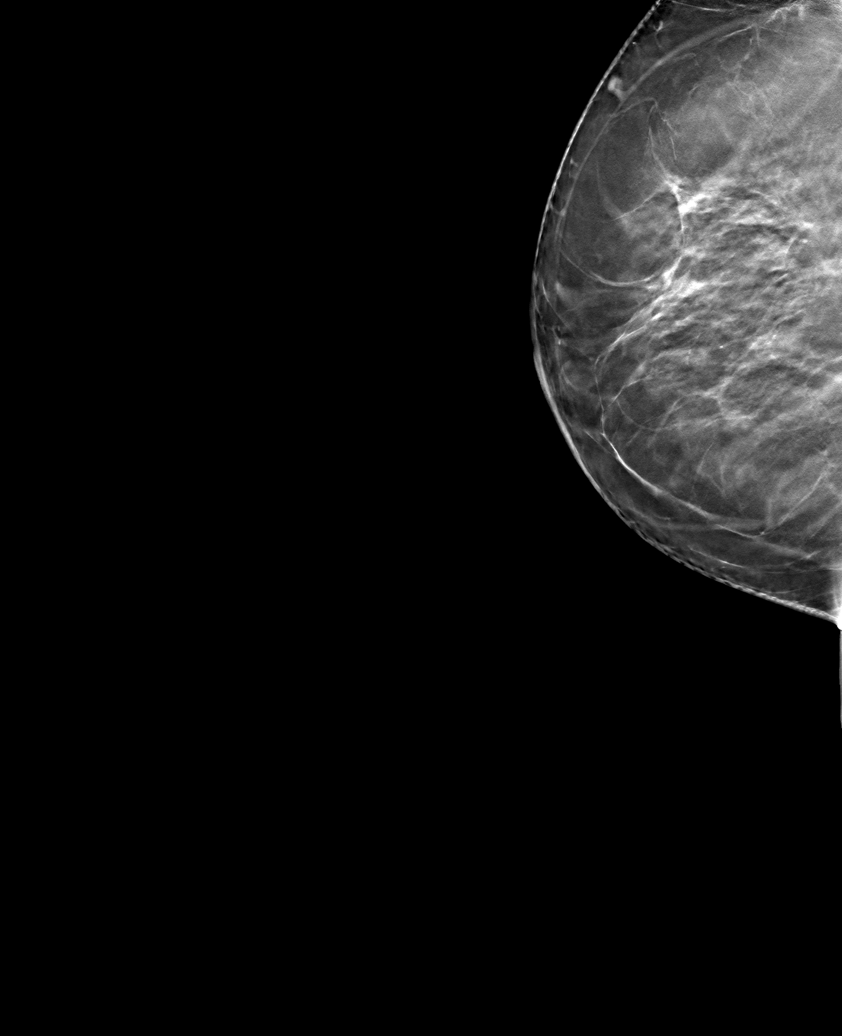

[6 of 30 positions shown; findings below may reference images not displayed]

ACR Breast Density Category b: There are scattered areas of
fibroglandular density.
FINDINGS: There are no findings suspicious for malignancy. Images were
processed with CAD.
IMPRESSION: No mammographic evidence of malignancy. A result letter of this
screening mammogram will be mailed directly to the patient.

RECOMMENDATION:
Screening mammogram in one year. (Code:[id number])

BI-RADS CATEGORY  1: Negative.

## 2019-08-04 ENCOUNTER — Ambulatory Visit: Payer: BLUE CROSS/BLUE SHIELD

## 2019-08-04 ENCOUNTER — Ambulatory Visit: Payer: BLUE CROSS/BLUE SHIELD | Attending: Internal Medicine

## 2019-08-04 DIAGNOSIS — Z20822 Contact with and (suspected) exposure to covid-19: Secondary | ICD-10-CM

## 2019-08-04 NOTE — Addendum Note (Signed)
Addended by: Chana Bode on: 08/04/2019 09:26 AM   Modules accepted: Orders

## 2019-08-05 LAB — NOVEL CORONAVIRUS, NAA: SARS-CoV-2, NAA: DETECTED — AB

## 2019-08-06 ENCOUNTER — Ambulatory Visit: Payer: BLUE CROSS/BLUE SHIELD

## 2020-02-24 ENCOUNTER — Other Ambulatory Visit: Payer: Self-pay | Admitting: Neurology

## 2022-01-14 ENCOUNTER — Ambulatory Visit: Payer: BLUE CROSS/BLUE SHIELD

## 2022-01-14 ENCOUNTER — Other Ambulatory Visit: Payer: Self-pay | Admitting: Physician Assistant

## 2022-01-14 DIAGNOSIS — Z1231 Encounter for screening mammogram for malignant neoplasm of breast: Secondary | ICD-10-CM
